# Patient Record
Sex: Female | Born: 2000 | Race: Black or African American | Hispanic: No | Marital: Single | Smoking: Never smoker
Health system: Southern US, Community
[De-identification: ages and names within clinical notes are randomized; demographics above are authoritative.]

## PROBLEM LIST (undated history)

## (undated) DIAGNOSIS — R011 Cardiac murmur, unspecified: Secondary | ICD-10-CM

## (undated) DIAGNOSIS — G510 Bell's palsy: Secondary | ICD-10-CM

## (undated) HISTORY — PX: NO PAST SURGERIES: SHX2092

## (undated) HISTORY — DX: Cardiac murmur, unspecified: R01.1

---
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
## 2022-05-09 ENCOUNTER — Inpatient Hospital Stay (HOSPITAL_COMMUNITY)
Admission: AD | Admit: 2022-05-09 | Discharge: 2022-05-09 | Disposition: A | Discharge status: Home / Self Care | Payer: Medicaid Other | Attending: Obstetrics and Gynecology | Admitting: Obstetrics and Gynecology

## 2022-05-09 ENCOUNTER — Encounter (HOSPITAL_COMMUNITY): Payer: Self-pay

## 2022-05-09 ENCOUNTER — Inpatient Hospital Stay (HOSPITAL_COMMUNITY): Payer: Medicaid Other

## 2022-05-09 ENCOUNTER — Other Ambulatory Visit: Payer: Self-pay

## 2022-05-09 DIAGNOSIS — O219 Vomiting of pregnancy, unspecified: Secondary | ICD-10-CM

## 2022-05-09 DIAGNOSIS — O26891 Other specified pregnancy related conditions, first trimester: Secondary | ICD-10-CM | POA: Diagnosis not present

## 2022-05-09 DIAGNOSIS — R55 Syncope and collapse: Secondary | ICD-10-CM | POA: Insufficient documentation

## 2022-05-09 DIAGNOSIS — Z3A09 9 weeks gestation of pregnancy: Secondary | ICD-10-CM | POA: Diagnosis not present

## 2022-05-09 DIAGNOSIS — R42 Dizziness and giddiness: Secondary | ICD-10-CM | POA: Diagnosis not present

## 2022-05-09 LAB — POCT PREGNANCY, URINE: Preg Test, Ur: POSITIVE — AB

## 2022-05-09 LAB — CBC
HCT: 36.2 % (ref 36.0–46.0)
Hemoglobin: 12.2 g/dL (ref 12.0–15.0)
MCH: 27.8 pg (ref 26.0–34.0)
MCHC: 33.7 g/dL (ref 30.0–36.0)
MCV: 82.5 fL (ref 80.0–100.0)
Platelets: 257 10*3/uL (ref 150–400)
RBC: 4.39 MIL/uL (ref 3.87–5.11)
RDW: 16.1 % — ABNORMAL HIGH (ref 11.5–15.5)
WBC: 13.5 10*3/uL — ABNORMAL HIGH (ref 4.0–10.5)
nRBC: 0 % (ref 0.0–0.2)

## 2022-05-09 LAB — ABO/RH: ABO/RH(D): A POS

## 2022-05-09 LAB — COMPREHENSIVE METABOLIC PANEL
ALT: 18 U/L (ref 0–44)
AST: 22 U/L (ref 15–41)
Albumin: 3.9 g/dL (ref 3.5–5.0)
Alkaline Phosphatase: 41 U/L (ref 38–126)
Anion gap: 8 (ref 5–15)
BUN: 10 mg/dL (ref 6–20)
CO2: 22 mmol/L (ref 22–32)
Calcium: 9.4 mg/dL (ref 8.9–10.3)
Chloride: 105 mmol/L (ref 98–111)
Creatinine, Ser: 0.71 mg/dL (ref 0.44–1.00)
GFR, Estimated: >60 mL/min (ref >60)
Glucose, Bld: 76 mg/dL (ref 70–99)
Potassium: 3.7 mmol/L (ref 3.5–5.1)
Sodium: 135 mmol/L (ref 135–145)
Total Bilirubin: 0.2 mg/dL — ABNORMAL LOW (ref 0.3–1.2)
Total Protein: 7.5 g/dL (ref 6.5–8.1)

## 2022-05-09 LAB — HCG, QUANTITATIVE, PREGNANCY: hCG, Beta Chain, Quant, S: 178761 m[IU]/mL — ABNORMAL HIGH (ref <5)

## 2022-05-09 LAB — GLUCOSE, CAPILLARY: Glucose-Capillary: 85 mg/dL (ref 70–99)

## 2022-05-09 MED ORDER — PROMETHAZINE HCL 12.5 MG PO TABS
12.5000 mg | ORAL_TABLET | Freq: Four times a day (QID) | ORAL | 0 refills | Status: DC | PRN
Start: 2022-05-09 — End: 2022-12-13

## 2022-05-09 MED ORDER — BONJESTA 20-20 MG PO TBCR: 1.0000 | EXTENDED_RELEASE_TABLET | Freq: Every day | ORAL | 2 refills | Status: DC

## 2022-05-09 NOTE — MAU Provider Note (Signed)
History     CSN: [id number]  Arrival date and time: 05/09/22 1935   Event Date/Time   First Provider Initiated Contact with Patient 05/09/22 2044      Chief Complaint  Patient presents with   Near Syncope    dizziness   Nausea   Emesis   Leslie Bryan is a 21 y.o. G1P0 at [redacted]w[redacted]d with Estimated LMP of 03/07/2022.  She presents today for Near Syncope (dizziness), Nausea, and Emesis.  She reports she was walking around and started "feeling lightheaded and dizzy with shallow breathing."  She states she sat down, but the symptoms continued and when trying to get up she "fell forward on my face" and required assistance to get up. She reports taking a home UPT about 5 weeks ago.  She reports she has not eaten anything and been able to keep it down for the past 3 days.  She states she has been trying to take dramamine with no relief of symptoms. She reports she was a daily MJ usage and has not discontinued but "slowed down."  She reports last vomiting about 2 hours ago.    OB History     Gravida  1   Para  0   Term  0   Preterm  0   AB  0   Living  0      SAB  0   IAB  0   Ectopic  0   Multiple  0   Live Births  0           Past Medical History:  Diagnosis Date   Bell's palsy    Heart murmur     History reviewed. No pertinent surgical history.  Family History  Problem Relation Age of Onset   Healthy Mother    Healthy Father     Social History   Tobacco Use   Smoking status: Never    Passive exposure: Yes   Smokeless tobacco: Never  Vaping Use   Vaping Use: Never used  Substance Use Topics   Alcohol use: Not Currently   Drug use: Yes    Types: Marijuana    Allergies: No Known Allergies  Medications Prior to Admission  Medication Sig Dispense Refill Last Dose   cyclobenzaprine (FLEXERIL) 5 MG tablet Take 1 tablet (5 mg total) by mouth 2 (two) times daily as needed for muscle spasms. (Patient not taking: Reported on 05/09/2022) 30 tablet 0  Not Taking   emtricitabine-tenofovir (TRUVADA) 200-300 MG tablet Take 1 tablet by mouth daily. (Patient not taking: Reported on 05/09/2022) 28 tablet 0 Not Taking   HYDROcodone-acetaminophen (HYCET) 7.5-325 mg/15 ml solution Take 10 mLs by mouth 4 (four) times daily as needed for moderate pain. (Patient not taking: Reported on 05/09/2022) 118 mL 0 Not Taking   methocarbamol (ROBAXIN) 500 MG tablet Take 1 tablet (500 mg total) by mouth 2 (two) times daily. (Patient not taking: Reported on 05/09/2022) 20 tablet 0 Not Taking   misoprostol (CYTOTEC) 200 MCG tablet Take 1 tablet (200 mcg total) by mouth once for 1 dose. Insert inside cheek FOUR (4) hours before your appointment. Allow pill to soften in your cheeck, then swallow with water. 1 tablet 0    ondansetron (ZOFRAN) 4 MG tablet Take 1 tablet (4 mg total) by mouth daily as needed for nausea or vomiting. (Patient not taking: Reported on 05/09/2022) 12 tablet 1 Not Taking   ondansetron (ZOFRAN-ODT) 8 MG disintegrating tablet Take 1 tablet (8 mg total)  by mouth every 8 (eight) hours as needed for nausea or vomiting. (Patient not taking: Reported on 05/09/2022) 20 tablet 0 Not Taking   raltegravir (ISENTRESS) 400 MG tablet Take 1 tablet (400 mg total) by mouth 2 (two) times daily. (Patient not taking: Reported on 05/09/2022) 56 tablet 0 Not Taking    Review of Systems  Gastrointestinal:  Positive for nausea and vomiting.  Neurological:  Positive for dizziness and light-headedness. Negative for headaches.   Physical Exam   Blood pressure (!) 121/50, pulse 72, temperature 99.2 F (37.3 C), resp. rate 16, height 5\' 2"  (1.575 m), weight 89.8 kg, last menstrual period 03/07/2022.  Orthostatics: 111/62 (71),   Physical Exam Constitutional:      General: She is not in acute distress.    Appearance: Normal appearance. She is not ill-appearing.  HENT:     Head: Normocephalic and atraumatic.  Eyes:     Conjunctiva/sclera: Conjunctivae normal.   Cardiovascular:     Rate and Rhythm: Normal rate and regular rhythm.  Pulmonary:     Effort: Pulmonary effort is normal. No respiratory distress.     Breath sounds: Normal breath sounds.  Abdominal:     General: Bowel sounds are normal.     Palpations: Abdomen is soft.     Tenderness: There is no abdominal tenderness.  Musculoskeletal:        General: Normal range of motion.     Cervical back: Normal range of motion.  Skin:    General: Skin is warm and dry.  Neurological:     Mental Status: She is alert and oriented to person, place, and time.  Psychiatric:        Mood and Affect: Mood normal.        Behavior: Behavior normal.    MAU Course  Procedures Results for orders placed or performed during the hospital encounter of 05/09/22 (from the past 24 hour(s))  Pregnancy, urine POC     Status: Abnormal   Collection Time: 05/09/22  7:49 PM  Result Value Ref Range   Preg Test, Ur POSITIVE (A) NEGATIVE  Glucose, capillary     Status: None   Collection Time: 05/09/22  8:31 PM  Result Value Ref Range   Glucose-Capillary 85 70 - 99 mg/dL  ABO/Rh     Status: None   Collection Time: 05/09/22  9:16 PM  Result Value Ref Range   ABO/RH(D) A POS    No rh immune globuloin      NOT A RH IMMUNE GLOBULIN CANDIDATE, PT RH POSITIVE Performed at [facility], [address], [place], [place]   CBC     Status: Abnormal   Collection Time: 05/09/22  9:16 PM  Result Value Ref Range   WBC 13.5 (H) 4.0 - 10.5 K/uL   RBC 4.39 3.87 - 5.11 MIL/uL   Hemoglobin 12.2 12.0 - 15.0 g/dL   HCT 05/11/22 44.3 - 15.4 %   MCV 82.5 80.0 - 100.0 fL   MCH 27.8 26.0 - 34.0 pg   MCHC 33.7 30.0 - 36.0 g/dL   RDW 00.8 (H) 67.6 - 19.5 %   Platelets 257 150 - 400 K/uL   nRBC 0.0 0.0 - 0.2 %  Comprehensive metabolic panel     Status: Abnormal   Collection Time: 05/09/22  9:16 PM  Result Value Ref Range   Sodium 135 135 - 145 mmol/L   Potassium 3.7 3.5 - 5.1 mmol/L   Chloride 105 98 - 111 mmol/L  CO2 22 22 - 32 mmol/L   Glucose, Bld 76 70 - 99 mg/dL   BUN 10 6 - 20 mg/dL   Creatinine, Ser 0.71 0.44 - 1.00 mg/dL   Calcium 9.4 8.9 - 10.3 mg/dL   Total Protein 7.5 6.5 - 8.1 g/dL   Albumin 3.9 3.5 - 5.0 g/dL   AST 22 15 - 41 U/L   ALT 18 0 - 44 U/L   Alkaline Phosphatase 41 38 - 126 U/L   Total Bilirubin 0.2 (L) 0.3 - 1.2 mg/dL   GFR, Estimated >60 >60 mL/min   Anion gap 8 5 - 15  hCG, quantitative, pregnancy     Status: Abnormal   Collection Time: 05/09/22  9:16 PM  Result Value Ref Range   hCG, Beta Chain, Quant, S 178,761 (H) <5 mIU/mL   US OB Comp Less 14 Wks  Result Date: 05/09/2022 CLINICAL DATA:  Pregnant, unknown LMP EXAM: OBSTETRIC <14 WK ULTRASOUND TECHNIQUE: Transabdominal ultrasound was performed for evaluation of the gestation as well as the maternal uterus and adnexal regions. COMPARISON:  None Available. FINDINGS: Intrauterine gestational sac: Single Yolk sac:  Visualized. Embryo:  Visualized. Cardiac Activity: Visualized. Heart Rate: 187 bpm CRL:   25.6 mm   9 w 2 d                  [place] EDC: 12/10/2022 Subchorionic hemorrhage:  None visualized. Maternal uterus/adnexae: Bilateral ovaries are within normal limits. No free fluid. IMPRESSION: Single intrauterine gestational sac with cardiac activity, measuring 9 weeks 2 days by crown-rump length, as above. Electronically Signed   By: Julian Hy M.D.   On: 05/09/2022 21:21    MDM Orthostatic Vitals Labs: UA, UPT, CBC, hCG, ABO, CBG Ultrasound Prescription Assessment and Plan  21 year old G1P0 at 9 weeks Dizziness Near Syncope Nausea  -POC Reviewed. -Patient offered and declines antiemetic. -Discussed sending script to pharmacy for home usage. -CBG normal. Orthostatics normal without significant variations.  -Discussed how these factors play role in reported symptoms. -Encouraged high protein snacks and small frequent meals throughout the day to help with nausea as well as blood sugar levels. -Discussed  sending for [place] to confirm dates as patient reports uncertainty with continuation of pregnancy. -Labs ordered.  Maryann Conners 05/09/2022, 8:44 PM   Reassessment (9:50 PM) SIUP at 9.2 weeks  -Results as above. -Provider to bedside, patient still without nausea.  -Discussed taking medications at home when necessary and as prescribed. -Given list of providers in the community to start [facility] if deciding against termination. -Patient without questions or concerns. -Encouraged to call primary office or return to MAU if symptoms worsen or with the onset of new symptoms. -Discharged to home in stable condition.  Maryann Conners MSN, CNM Advanced Practice Provider, [facility]

## 2022-05-09 NOTE — Discharge Instructions (Signed)
  [place] [facility] Providers          [facility]  [address], [place], [place] [address]  [phone]  [facility]  [address], [place], [place] [address]  [phone]  [facility]  [address], [place], [place] [address]  [phone]  [facility] [place]  [address], [place], [place] [address]  [phone]  [facility]  [address]First floor), [place], [place] [address]  [phone]  [facility]  [address], [place], [place] [address]  [phone]  [facility]  [address], [place], [place] [address]  [phone]  [facility]  [address], [place], [place] [address]  [phone]  [facility]  [address], [place], [place] [address]  [phone]  [facility]  [address], [place], [place] [address]  [phone]  [facility]  [address],  [place], [place] [address]  [phone]  [facility]   [address], [place], [place] [address]  [phone]  [facility]  [address], [place], [place] [address]   [phone]  [facility]  [address], [place], [place] [address]  [phone]         

## 2022-05-09 NOTE — MAU Note (Addendum)
.  Leslie Bryan is a 21 y.o.G1P0  here in MAU reporting: Nasuea and vomiting x 3 days. Reported an episode today of dizziness with near syncope. Pt reports she did not lose consciousness but her arms were weak. Pt ambulatory to triage. Denies vaginal bleeding or abnormal discharge, or foul odor.  18g NS loc noted in [place] LMP: 03/07/2022 Onset of complaint: 3 days Pain score: 0 Vitals:   05/09/22 2013  BP: (!) 121/50  Pulse: 72  Resp: 16  Temp: 99.4 F (37.4 C)      Lab orders placed from triage:  UA

## 2022-06-16 LAB — OB RESULTS CONSOLE GC/CHLAMYDIA
Chlamydia: NEGATIVE
Neisseria Gonorrhea: NEGATIVE

## 2022-06-18 ENCOUNTER — Encounter: Payer: Self-pay | Admitting: *Deleted

## 2022-06-18 ENCOUNTER — Telehealth: Payer: Self-pay | Admitting: *Deleted

## 2022-06-18 ENCOUNTER — Telehealth: Payer: Medicaid Other

## 2022-06-18 NOTE — Telephone Encounter (Signed)
3:25pm. Patient [name] virtual new ob intake. I called her and heard message " call cannot be completed at this time" Leslie Bryan 3:32 I called again and heard message " call cannot be completed at this time" I sent message to front desk to notify. I also sent a MyChart message to patient to notify her and to tell her to call to reschedule. Leslie Bryan

## 2022-06-22 ENCOUNTER — Encounter: Payer: Medicaid Other | Admitting: Student

## 2022-06-24 ENCOUNTER — Inpatient Hospital Stay (HOSPITAL_COMMUNITY)
Admission: AD | Admit: 2022-06-24 | Discharge: 2022-06-24 | Disposition: A | Discharge status: Home / Self Care | Payer: Medicaid Other | Attending: Obstetrics and Gynecology | Admitting: Obstetrics and Gynecology

## 2022-06-24 ENCOUNTER — Encounter (HOSPITAL_COMMUNITY): Payer: Self-pay | Admitting: Obstetrics and Gynecology

## 2022-06-24 DIAGNOSIS — Z3A15 15 weeks gestation of pregnancy: Secondary | ICD-10-CM

## 2022-06-24 DIAGNOSIS — R102 Pelvic and perineal pain: Secondary | ICD-10-CM | POA: Insufficient documentation

## 2022-06-24 DIAGNOSIS — R519 Headache, unspecified: Secondary | ICD-10-CM | POA: Insufficient documentation

## 2022-06-24 DIAGNOSIS — O26892 Other specified pregnancy related conditions, second trimester: Secondary | ICD-10-CM

## 2022-06-24 DIAGNOSIS — N949 Unspecified condition associated with female genital organs and menstrual cycle: Secondary | ICD-10-CM | POA: Diagnosis not present

## 2022-06-24 LAB — URINALYSIS, ROUTINE W REFLEX MICROSCOPIC
Bilirubin Urine: NEGATIVE
Glucose, UA: NEGATIVE mg/dL
Hgb urine dipstick: NEGATIVE
Ketones, ur: NEGATIVE mg/dL
Leukocytes,Ua: NEGATIVE
Nitrite: NEGATIVE
Protein, ur: NEGATIVE mg/dL
Specific Gravity, Urine: 1.02 (ref 1.005–1.030)
pH: 7 (ref 5.0–8.0)

## 2022-06-24 MED ORDER — ACETAMINOPHEN 500 MG PO TABS
1000.0000 mg | ORAL_TABLET | Freq: Once | ORAL | Status: AC
  Administered 2022-06-24: 1000 mg via ORAL
  Filled 2022-06-24: qty 2

## 2022-06-24 MED ORDER — CAFFEINE 200 MG PO TABS: 200.0000 mg | ORAL_TABLET | Freq: Once | ORAL | Status: DC

## 2022-06-24 MED ORDER — METOCLOPRAMIDE HCL 10 MG PO TABS
10.0000 mg | ORAL_TABLET | Freq: Once | ORAL | Status: AC
  Administered 2022-06-24: 10 mg via ORAL
  Filled 2022-06-24: qty 1

## 2022-06-24 MED ORDER — ACETAMINOPHEN-CAFFEINE 500-65 MG PO TABS: 1.0000 | ORAL_TABLET | Freq: Once | ORAL | Status: DC

## 2022-06-24 NOTE — MAU Note (Signed)
.  Leslie Bryan is a 21 y.o. at [redacted]w[redacted]d here in MAU reporting: severe HA since yesterday around 0800, has been on and off for the last week. Pt took Tylenol 1000mg  and Motrin 400mg  , and only Tylenol helps with some relief. Pt also reports a sharp, pelvic pain on her left, and intense bilateral side of rib cage pain that feels throbbing and sharp. Pt denies VB or LOF.   Onset of complaint: one week Pain score: 8/10 HA, 6/10 pelvic, 7/10 rib cage Vitals:   06/24/22 2043  BP: 111/61  Pulse: 89  Resp: 18  Temp: 98.4 F (36.9 C)  SpO2: 100%     FHT:156 Lab orders placed from triage:  ua

## 2022-06-24 NOTE — Discharge Instructions (Signed)
Round Ligament Massage & Stretches  Massage: Starting at the middle of your pubic bone, trace little circles in a wide U from your pubic bone to your hip bones on both sides.  Then starting just above your pubic bone, press in and down, alternating sides to create a gentle rocking of your uterus back and forth.  Move your hands up the sides of your belly and back down. Do this 3-5 times upon waking and before bed.  Stretches: Get on hands and knees and alternate arching your back deeply while inhaling, and then rounding your back while exhaling. Modified runners lunge:  - Sit on a chair with half of your bottom on the chair and half off.  - Sit up tall, plant your front foot, and stretch your other foot out behind you.  - Breathe deeply for 5 breaths and then do the other side.    PREGNANCY SUPPORT BELT: You are not alone, Seventy-five percent of women have some sort of abdominal or back pain at some point in their pregnancy. Your baby is growing at a fast pace, which means that your whole body is rapidly trying to adjust to the changes. As your uterus grows, your back may start feeling a bit under stress and this can result in back or abdominal pain that can go from mild, and therefore bearable, to severe pains that will not allow you to sit or lay down comfortably, When it comes to dealing with pregnancy-related pains and cramps, some pregnant women usually prefer natural remedies, which the market is filled with nowadays. For example, wearing a pregnancy support belt can help ease and lessen your discomfort and pain. WHAT ARE THE BENEFITS OF WEARING A PREGNANCY SUPPORT BELT? A pregnancy support belt provides support to the lower portion of the belly taking some of the weight of the growing uterus and distributing to the other parts of your body. It is designed make you comfortable and gives you extra support. Over the years, the pregnancy apparel market has been studying the needs and wants of  pregnant women and they have come up with the most comfortable pregnancy support belts that woman could ever ask for. In fact, you will no longer have to wear a stretched-out or bulky pregnancy belt that is visible underneath your clothes and makes you feel even more uncomfortable. Nowadays, a pregnancy support belt is made of comfortable and stretchy materials that will not irritate your skin but will actually make you feel at ease and you will not even notice you are wearing it. They are easy to put on and adjust during the day and can be worn at night for additional support.  BENEFITS: Relives Back pain Relieves Abdominal Muscle and Leg Pain Stabilizes the Pelvic Ring Offers a Cushioned Abdominal Lift Pad Relieves pressure on the Sciatic Nerve Within Minutes WHERE TO GET YOUR PREGNANCY BELT: [facility] [phone] @[address] [place], [place] [address]  [facility]  [address], [place], [place] [address]  [phone]  [facility]  [address] [place], [place] [address]  [phone]  [facility]  [address] [place], [place] [address]  [phone]  [facility]  [address], [place], [place] [address]  [phone]  

## 2022-06-24 NOTE — MAU Provider Note (Signed)
History     CSN: [id number]  Arrival date and time: 06/24/22 2011   Event Date/Time   First Provider Initiated Contact with Patient 06/24/22 2126      Chief Complaint  Patient presents with   Abdominal Pain   Headache   Leslie Bryan , a  21 y.o. G1P0000 at [redacted]w[redacted]d presents to MAU with complaints of Headache over the last 24 hous. Attempted IBU, Tylenol without relief last dose 4 hours ago. Endorses Blurred Vision and photophobia. Denies nausea and vomiting. Denies hx of history. Describes as a "Frontal headache. Sharp achy. 8/10". Water Recall 3 cups :if that per day."  Also notes bilateral Rib and side pain, on-going for 1 week. Attempted the above without relief. Notes on directly on the sides. Describes  as Intermittent, sharpe and tight on the sides. Lasting 30-40 mins and spontaneously subsides. Massage, and position changes unaffected. Last tine this occurred was 9/10 currently no pain 0/10.      OB History     Gravida  1   Para  0   Term  0   Preterm  0   AB  0   Living  0      SAB  0   IAB  0   Ectopic  0   Multiple  0   Live Births  0           Past Medical History:  Diagnosis Date   Bell's palsy    Heart murmur     Past Surgical History:  Procedure Laterality Date   NO PAST SURGERIES      Family History  Problem Relation Age of Onset   Healthy Mother    Healthy Father     Social History   Tobacco Use   Smoking status: Never    Passive exposure: Yes   Smokeless tobacco: Never  Vaping Use   Vaping Use: Never used  Substance Use Topics   Alcohol use: Not Currently   Drug use: Not Currently    Types: Marijuana    Allergies: No Known Allergies  Medications Prior to Admission  Medication Sig Dispense Refill Last Dose   promethazine (PHENERGAN) 12.5 MG tablet Take 1 tablet (12.5 mg total) by mouth every 6 (six) hours as needed for nausea or vomiting. 30 tablet 0 Past Week   Doxylamine-Pyridoxine ER (BONJESTA) 20-20 MG  TBCR Take 1 tablet by mouth at bedtime. 60 tablet 2     Review of Systems  Constitutional:  Negative for chills, fatigue and fever.  Eyes:  Negative for pain and visual disturbance.  Respiratory:  Negative for apnea, shortness of breath and wheezing.   Cardiovascular:  Negative for chest pain and palpitations.  Gastrointestinal:  Negative for abdominal pain, constipation, diarrhea, nausea and vomiting.  Genitourinary:  Positive for pelvic pain. Negative for difficulty urinating, dysuria, vaginal bleeding, vaginal discharge and vaginal pain.  Musculoskeletal:  Negative for back pain.  Neurological:  Positive for dizziness and headaches. Negative for seizures and weakness.  Psychiatric/Behavioral:  Negative for suicidal ideas.    Physical Exam   Blood pressure (!) 112/58, pulse 78, temperature 98.4 F (36.9 C), temperature source Oral, resp. rate 18, height 5\' 2"  (1.575 m), weight 94.5 kg, last menstrual period 03/07/2022, SpO2 100 %.  Physical Exam Vitals and nursing note reviewed.  Constitutional:      General: She is not in acute distress.    Appearance: Normal appearance. She is not ill-appearing.  HENT:  Head: Normocephalic.  Cardiovascular:     Rate and Rhythm: Normal rate and regular rhythm.  Pulmonary:     Effort: Pulmonary effort is normal.  Abdominal:     Palpations: Abdomen is soft.     Tenderness: There is no abdominal tenderness.  Musculoskeletal:        General: Swelling present.     Cervical back: Normal range of motion.  Skin:    General: Skin is warm and dry.  Neurological:     Mental Status: She is alert and oriented to person, place, and time.     GCS: GCS eye subscore is 4. GCS verbal subscore is 5. GCS motor subscore is 6.     Deep Tendon Reflexes: Reflexes normal.  Psychiatric:        Mood and Affect: Mood normal.     MAU Course  Procedures Orders Placed This Encounter  Procedures   Urinalysis, Routine w reflex microscopic   Discharge patient    Meds ordered this encounter  Medications   DISCONTD: acetaminophen-caffeine (EXCEDRIN TENSION HEADACHE) 500-65 MG per tablet 1 tablet   DISCONTD: caffeine tablet 200 mg   acetaminophen (TYLENOL) tablet 1,000 mg   metoCLOPramide (REGLAN) tablet 10 mg   Results for orders placed or performed during the hospital encounter of 06/24/22 (from the past 24 hour(s))  Urinalysis, Routine w reflex microscopic     Status: Abnormal   Collection Time: 06/24/22  8:34 PM  Result Value Ref Range   Color, Urine YELLOW YELLOW   APPearance CLOUDY (A) CLEAR   Specific Gravity, Urine 1.020 1.005 - 1.030   pH 7.0 5.0 - 8.0   Glucose, UA NEGATIVE NEGATIVE mg/dL   Hgb urine dipstick NEGATIVE NEGATIVE   Bilirubin Urine NEGATIVE NEGATIVE   Ketones, ur NEGATIVE NEGATIVE mg/dL   Protein, ur NEGATIVE NEGATIVE mg/dL   Nitrite NEGATIVE NEGATIVE   Leukocytes,Ua NEGATIVE NEGATIVE     MDM - Urine cloudy but otherwise normal. Low suspicion for headache caused by dehydration.  - Headache improved 0/10 with PO Excedrin PO and Hydration.  - Plan for discharge.   Assessment and Plan   1. Headache in pregnancy, antepartum, second trimester   2. [redacted] weeks gestation of pregnancy   3. Round ligament pain    - Reviewed the normalcy of headaches in pregnancy. Alternative comfort measures reviewed in MAU - Also discussed round ligament pain and some stretching that can ben performed while at home.Also recommended a maternity support belt. List of store options provided. - Worsening signs and return precautions reviewed.  - Preterm labor precautions reviewed.  - Patient discharged home in stable condition and may return to MAU as needed.     Claudette Head, MSN CNM  06/24/2022, 9:27 PM

## 2022-07-01 ENCOUNTER — Telehealth: Payer: Medicaid Other

## 2022-07-09 LAB — OB RESULTS CONSOLE ABO/RH: "RH Type ": POSITIVE

## 2022-07-09 LAB — OB RESULTS CONSOLE RPR: RPR: NONREACTIVE

## 2022-07-09 LAB — OB RESULTS CONSOLE HEPATITIS B SURFACE ANTIGEN: Hepatitis B Surface Ag: NEGATIVE

## 2022-07-09 LAB — HEPATITIS C ANTIBODY: HCV Ab: NEGATIVE

## 2022-07-09 LAB — OB RESULTS CONSOLE ANTIBODY SCREEN: Antibody Screen: NEGATIVE

## 2022-07-09 LAB — OB RESULTS CONSOLE HIV ANTIBODY (ROUTINE TESTING): HIV: NONREACTIVE

## 2022-07-09 LAB — OB RESULTS CONSOLE RUBELLA ANTIBODY, IGM: Rubella: IMMUNE

## 2022-07-13 ENCOUNTER — Other Ambulatory Visit: Payer: Self-pay | Admitting: Obstetrics and Gynecology

## 2022-07-13 DIAGNOSIS — Z363 Encounter for antenatal screening for malformations: Secondary | ICD-10-CM

## 2022-07-21 ENCOUNTER — Telehealth: Payer: Medicaid Other

## 2022-07-21 ENCOUNTER — Encounter: Payer: Self-pay | Admitting: Family Medicine

## 2022-07-27 ENCOUNTER — Encounter: Payer: Medicaid Other | Admitting: Obstetrics and Gynecology

## 2022-08-12 ENCOUNTER — Ambulatory Visit: Payer: Medicaid Other | Admitting: *Deleted

## 2022-08-12 ENCOUNTER — Ambulatory Visit: Payer: Medicaid Other | Attending: Obstetrics and Gynecology

## 2022-08-12 VITALS — BP 127/69 | HR 86

## 2022-08-12 DIAGNOSIS — Z363 Encounter for antenatal screening for malformations: Secondary | ICD-10-CM | POA: Insufficient documentation

## 2022-08-12 DIAGNOSIS — Z3689 Encounter for other specified antenatal screening: Secondary | ICD-10-CM | POA: Insufficient documentation

## 2022-08-13 ENCOUNTER — Other Ambulatory Visit: Payer: Self-pay | Admitting: *Deleted

## 2022-08-13 DIAGNOSIS — O0932 Supervision of pregnancy with insufficient antenatal care, second trimester: Secondary | ICD-10-CM

## 2022-09-09 ENCOUNTER — Ambulatory Visit: Payer: Medicaid Other | Admitting: *Deleted

## 2022-09-09 ENCOUNTER — Ambulatory Visit: Payer: Medicaid Other | Attending: Maternal & Fetal Medicine

## 2022-09-09 VITALS — BP 126/62 | HR 87

## 2022-09-09 DIAGNOSIS — O99891 Other specified diseases and conditions complicating pregnancy: Secondary | ICD-10-CM

## 2022-09-09 DIAGNOSIS — G510 Bell's palsy: Secondary | ICD-10-CM | POA: Diagnosis not present

## 2022-09-09 DIAGNOSIS — O283 Abnormal ultrasonic finding on antenatal screening of mother: Secondary | ICD-10-CM | POA: Insufficient documentation

## 2022-09-09 DIAGNOSIS — R011 Cardiac murmur, unspecified: Secondary | ICD-10-CM

## 2022-09-09 DIAGNOSIS — O99352 Diseases of the nervous system complicating pregnancy, second trimester: Secondary | ICD-10-CM | POA: Diagnosis not present

## 2022-09-09 DIAGNOSIS — O0932 Supervision of pregnancy with insufficient antenatal care, second trimester: Secondary | ICD-10-CM

## 2022-09-09 DIAGNOSIS — E669 Obesity, unspecified: Secondary | ICD-10-CM

## 2022-09-09 DIAGNOSIS — O99212 Obesity complicating pregnancy, second trimester: Secondary | ICD-10-CM

## 2022-09-09 DIAGNOSIS — Z3A26 26 weeks gestation of pregnancy: Secondary | ICD-10-CM

## 2022-10-27 ENCOUNTER — Inpatient Hospital Stay (HOSPITAL_BASED_OUTPATIENT_CLINIC_OR_DEPARTMENT_OTHER): Payer: Medicaid Other

## 2022-10-27 ENCOUNTER — Inpatient Hospital Stay (HOSPITAL_COMMUNITY)
Admission: AD | Admit: 2022-10-27 | Discharge: 2022-10-28 | Disposition: A | Discharge status: Home / Self Care | Payer: Medicaid Other | Attending: Obstetrics & Gynecology | Admitting: Obstetrics & Gynecology

## 2022-10-27 DIAGNOSIS — Z3A33 33 weeks gestation of pregnancy: Secondary | ICD-10-CM

## 2022-10-27 DIAGNOSIS — O26893 Other specified pregnancy related conditions, third trimester: Secondary | ICD-10-CM

## 2022-10-27 DIAGNOSIS — O99413 Diseases of the circulatory system complicating pregnancy, third trimester: Secondary | ICD-10-CM | POA: Diagnosis not present

## 2022-10-27 DIAGNOSIS — R0789 Other chest pain: Secondary | ICD-10-CM | POA: Diagnosis not present

## 2022-10-27 DIAGNOSIS — O26899 Other specified pregnancy related conditions, unspecified trimester: Secondary | ICD-10-CM | POA: Diagnosis not present

## 2022-10-27 DIAGNOSIS — I498 Other specified cardiac arrhythmias: Secondary | ICD-10-CM | POA: Insufficient documentation

## 2022-10-27 DIAGNOSIS — R102 Pelvic and perineal pain: Secondary | ICD-10-CM

## 2022-10-27 DIAGNOSIS — R109 Unspecified abdominal pain: Secondary | ICD-10-CM | POA: Diagnosis not present

## 2022-10-27 DIAGNOSIS — O4700 False labor before 37 completed weeks of gestation, unspecified trimester: Secondary | ICD-10-CM

## 2022-10-27 DIAGNOSIS — O4703 False labor before 37 completed weeks of gestation, third trimester: Secondary | ICD-10-CM | POA: Insufficient documentation

## 2022-10-27 LAB — WET PREP, GENITAL
Clue Cells Wet Prep HPF POC: NONE SEEN
Sperm: NONE SEEN
Trich, Wet Prep: NONE SEEN
WBC, Wet Prep HPF POC: >=10 — AB (ref <10)

## 2022-10-27 LAB — FETAL FIBRONECTIN: Fetal Fibronectin: NEGATIVE

## 2022-10-27 LAB — URINALYSIS, ROUTINE W REFLEX MICROSCOPIC
Bilirubin Urine: NEGATIVE
Glucose, UA: NEGATIVE mg/dL
Hgb urine dipstick: NEGATIVE
Ketones, ur: 80 mg/dL — AB
Nitrite: NEGATIVE
Protein, ur: NEGATIVE mg/dL
Specific Gravity, Urine: 1.017 (ref 1.005–1.030)
pH: 6 (ref 5.0–8.0)

## 2022-10-27 MED ORDER — HYOSCYAMINE SULFATE 0.125 MG SL SUBL
0.1250 mg | SUBLINGUAL_TABLET | Freq: Once | SUBLINGUAL | Status: AC
  Administered 2022-10-27: 0.125 mg via SUBLINGUAL
  Filled 2022-10-27: qty 1

## 2022-10-27 MED ORDER — SIMETHICONE 80 MG PO CHEW
80.0000 mg | CHEWABLE_TABLET | Freq: Once | ORAL | Status: AC
  Administered 2022-10-27: 80 mg via ORAL
  Filled 2022-10-27: qty 1

## 2022-10-27 MED ORDER — ALUM & MAG HYDROXIDE-SIMETH 200-200-20 MG/5ML PO SUSP
30.0000 mL | Freq: Once | ORAL | Status: AC
  Administered 2022-10-27: 30 mL via ORAL
  Filled 2022-10-27: qty 30

## 2022-10-27 MED ORDER — LIDOCAINE VISCOUS HCL 2 % MT SOLN
15.0000 mL | Freq: Once | OROMUCOSAL | Status: AC
  Administered 2022-10-27: 15 mL via ORAL
  Filled 2022-10-27: qty 15

## 2022-10-27 MED ORDER — NIFEDIPINE 10 MG PO CAPS
10.0000 mg | ORAL_CAPSULE | ORAL | Status: DC | PRN
  Administered 2022-10-27 (×2): 10 mg via ORAL
  Filled 2022-10-27 (×2): qty 1

## 2022-10-27 MED ORDER — CYCLOBENZAPRINE HCL 5 MG PO TABS: 10.0000 mg | ORAL_TABLET | Freq: Once | ORAL | Status: DC

## 2022-10-27 NOTE — MAU Note (Signed)
Leslie Bryan is a 21 y.o. at 86w3dhere in MAU reporting: when she woke up (1130-1145)was having a real severe pain above her belly button, in the middle.   Has had tightness in her chest, comes and goes, started around 1200.  No fever, has had a horrible cough the past wk, was coughing up brown mucous, is finally getting over that.  No bleeding or LOF, reports +FM Onset of complaint: 1130 Pain score: abd  7, chest 4 (not really hurting now) Vitals:   10/27/22 1837 10/27/22 1840  BP:  133/71  Pulse:  76  Resp:  20  Temp:  98.9 F (37.2 C)  SpO2: 100% 100%     FHT:144 Lab orders placed from triage:  urine  O2sat remained at 100% the entire time in triage

## 2022-10-27 NOTE — MAU Provider Note (Cosign Needed Addendum)
History     CSN: [id number]  Arrival date and time: 10/27/22 1743   Event Date/Time   First Provider Initiated Contact with Patient 10/27/22 1936      Chief Complaint  Patient presents with   Chest Pain   Abdominal Pain   Leslie Bryan , a  22 y.o. G1P0000 at 27w3dpresents to MAU with complaints of "chest tightness" and "abdominal pain", that started today. Patient reports that she woke up this afternoon about 1230 and states she had some "chest tightness and pressure" on the center of her chest between her breast. She states that at that time pain was 4/10, but currently states its "not that bad." She also endorses a "horrible cough" for the past week. She denies fever, chills, cold and flu-like symptoms. She also denies a history of asthma. She endorse acid reflux but denies it "feeling the same." She also reports intermittent abdominal pain right above her belly button. She denies feeling like contractions. She states the pain is like abdominal pressure and "gets more intense" but never goes away. She states pain is worsened by fetal movement. Attempted PO Tylenol today at 1pm without relief. She denies vaginal bleeding and leaking of fluid but noted that she has been having green vaginal discharge for the past week. She endorses positive fetal movement.          OB History     Gravida  1   Para  0   Term  0   Preterm  0   AB  0   Living  0      SAB  0   IAB  0   Ectopic  0   Multiple  0   Live Births  0           Past Medical History:  Diagnosis Date   Bell's palsy    Heart murmur     Past Surgical History:  Procedure Laterality Date   NO PAST SURGERIES      Family History  Problem Relation Age of Onset   Cancer Mother    Healthy Mother    Healthy Father    Hypertension Maternal Grandmother    Cancer Other    Asthma Neg Hx    Diabetes Neg Hx    Heart disease Neg Hx    Stroke Neg Hx     Social History   Tobacco Use   Smoking  status: Never    Passive exposure: Yes   Smokeless tobacco: Never  Vaping Use   Vaping Use: Never used  Substance Use Topics   Alcohol use: Not Currently   Drug use: Not Currently    Types: Marijuana    Comment: last weed Jul 24 2022    Allergies: No Known Allergies  Medications Prior to Admission  Medication Sig Dispense Refill Last Dose   cholecalciferol (VITAMIN D3) 25 MCG (1000 UNIT) tablet Take 2,000 Units by mouth daily.      ferrous sulfate 325 (65 FE) MG EC tablet Take 325 mg by mouth 3 (three) times daily with meals.      Prenatal Vit-Fe Fumarate-FA (MULTIVITAMIN-PRENATAL) 27-0.8 MG TABS tablet Take 1 tablet by mouth daily at 12 noon.      promethazine (PHENERGAN) 12.5 MG tablet Take 1 tablet (12.5 mg total) by mouth every 6 (six) hours as needed for nausea or vomiting. (Patient not taking: Reported on 09/09/2022) 30 tablet 0     Review of Systems  Constitutional:  Negative for  chills, fatigue and fever.  Eyes:  Negative for pain and visual disturbance.  Respiratory:  Positive for cough and chest tightness. Negative for apnea, shortness of breath and wheezing.   Cardiovascular:  Negative for chest pain and palpitations.  Gastrointestinal:  Positive for abdominal pain. Negative for constipation, diarrhea, nausea and vomiting.  Genitourinary:  Positive for vaginal discharge. Negative for difficulty urinating, dysuria, pelvic pain, vaginal bleeding and vaginal pain.  Musculoskeletal:  Negative for back pain.  Neurological:  Negative for seizures, weakness and headaches.  Psychiatric/Behavioral:  Negative for suicidal ideas.    Physical Exam   Blood pressure 138/70, pulse 94, temperature 98.9 F (37.2 C), temperature source Oral, resp. rate 20, height '5\' 2"'$  (1.575 m), weight 101.7 kg, last menstrual period 03/07/2022, SpO2 100 %.  Physical Exam Vitals and nursing note reviewed.  Constitutional:      General: She is not in acute distress.    Appearance: Normal appearance.   HENT:     Head: Normocephalic.  Cardiovascular:     Rate and Rhythm: Normal rate.     Heart sounds: Normal heart sounds.  Pulmonary:     Effort: Pulmonary effort is normal.     Breath sounds: Normal breath sounds.  Chest:     Chest wall: No tenderness.  Abdominal:     Palpations: Abdomen is soft.     Tenderness: There is abdominal tenderness in the right upper quadrant and left upper quadrant.     Comments: Patient winces at palpation to lateral sides.   Musculoskeletal:     Cervical back: Normal range of motion.  Skin:    General: Skin is warm and dry.     Capillary Refill: Capillary refill takes 2 to 3 seconds.  Neurological:     Mental Status: She is alert and oriented to person, place, and time.  Psychiatric:        Mood and Affect: Mood normal.    FHT: 145bpm with moderate variability. Accels present, decels absent (appropriate for gestational age.)  Toco: UI     MAU Course  Procedures Orders Placed This Encounter  Procedures   Wet prep, genital   [place] MFM OB Limited   Urinalysis, Routine w reflex microscopic -Urine, Clean Catch   ED EKG   Meds ordered this encounter  Medications   DISCONTD: cyclobenzaprine (FLEXERIL) tablet 10 mg   AND Linked Order Group    alum & mag hydroxide-simeth (MAALOX/MYLANTA) 200-200-20 MG/5ML suspension 30 mL    lidocaine (XYLOCAINE) 2 % viscous mouth solution 15 mL    MDM - EKG results Sinus rhythm with Marked sinus arrhythmia.   - EKG Reviewed by Dr. Ernestina Patches in MAU. Per recommended outpatient OB Cards follow up.   - Report and Transfer of care given to M. Oday Ridings CNM at 8:46 PM Shantonette Isaias Sakai) Rollene Rotunda, MSN, [place] [facility]  10/27/22 8:48 PM   Did not feel any better with GI cocktail Levsin and Simethicone given with good relief Review of EFM tracing showed regular contractions at beginning of tracing FFn sent  Dilation: Closed Effacement (%): 0 Cervical Position: Posterior Station: -3 Presentation:  Vertex Exam by:: Jimmye Norman CNM  Will discharge home if FFn is negative Two doses of Procardia given with cessation of UCs FFn is negative Discharge home with PTL precautions   Assessment and Plan  Single IUP at 79w3dAbdominal pain in pregnancy Preterm uterine contractions with Neg FFn and no cervical dilation  Discharge home  PTL precautions Followup in  office  Encouraged to return if she develops worsening of symptoms, increase in pain, fever, or other concerning symptoms.   Seabron Spates, CNM

## 2022-10-28 LAB — GC/CHLAMYDIA PROBE AMP (~~LOC~~) NOT AT ARMC
Chlamydia: NEGATIVE
Comment: NEGATIVE
Comment: NORMAL
Neisseria Gonorrhea: NEGATIVE

## 2022-11-10 LAB — OB RESULTS CONSOLE GBS: GBS: NEGATIVE

## 2022-12-08 ENCOUNTER — Encounter (HOSPITAL_COMMUNITY): Payer: Self-pay

## 2022-12-08 ENCOUNTER — Other Ambulatory Visit: Payer: Self-pay | Admitting: Obstetrics and Gynecology

## 2022-12-08 ENCOUNTER — Telehealth (HOSPITAL_COMMUNITY): Payer: Self-pay | Admitting: *Deleted

## 2022-12-08 NOTE — Telephone Encounter (Signed)
Preadmission screen  

## 2022-12-09 ENCOUNTER — Other Ambulatory Visit: Payer: Self-pay | Admitting: Obstetrics and Gynecology

## 2022-12-09 ENCOUNTER — Telehealth (HOSPITAL_COMMUNITY): Payer: Self-pay | Admitting: *Deleted

## 2022-12-09 NOTE — Telephone Encounter (Signed)
Preadmission screen  

## 2022-12-10 ENCOUNTER — Encounter (HOSPITAL_COMMUNITY): Payer: Self-pay | Admitting: Obstetrics and Gynecology

## 2022-12-10 ENCOUNTER — Other Ambulatory Visit: Payer: Self-pay

## 2022-12-10 ENCOUNTER — Inpatient Hospital Stay (HOSPITAL_COMMUNITY): Payer: Medicaid Other | Admitting: Anesthesiology

## 2022-12-10 ENCOUNTER — Inpatient Hospital Stay (HOSPITAL_COMMUNITY): Payer: Medicaid Other

## 2022-12-10 ENCOUNTER — Inpatient Hospital Stay (HOSPITAL_COMMUNITY)
Admission: RE | Admit: 2022-12-10 | Discharge: 2022-12-13 | DRG: 787 | Disposition: A | Discharge status: Home / Self Care | Payer: Medicaid Other | Attending: Obstetrics and Gynecology | Admitting: Obstetrics and Gynecology

## 2022-12-10 DIAGNOSIS — O26893 Other specified pregnancy related conditions, third trimester: Secondary | ICD-10-CM | POA: Diagnosis present

## 2022-12-10 DIAGNOSIS — Z3490 Encounter for supervision of normal pregnancy, unspecified, unspecified trimester: Secondary | ICD-10-CM

## 2022-12-10 DIAGNOSIS — O480 Post-term pregnancy: Secondary | ICD-10-CM | POA: Diagnosis present

## 2022-12-10 DIAGNOSIS — O9902 Anemia complicating childbirth: Secondary | ICD-10-CM | POA: Diagnosis present

## 2022-12-10 DIAGNOSIS — O328XX0 Maternal care for other malpresentation of fetus, not applicable or unspecified: Secondary | ICD-10-CM | POA: Diagnosis present

## 2022-12-10 DIAGNOSIS — Z3A41 41 weeks gestation of pregnancy: Secondary | ICD-10-CM | POA: Diagnosis not present

## 2022-12-10 DIAGNOSIS — Z98891 History of uterine scar from previous surgery: Principal | ICD-10-CM

## 2022-12-10 DIAGNOSIS — O99214 Obesity complicating childbirth: Secondary | ICD-10-CM

## 2022-12-10 DIAGNOSIS — R030 Elevated blood-pressure reading, without diagnosis of hypertension: Secondary | ICD-10-CM | POA: Insufficient documentation

## 2022-12-10 LAB — TYPE AND SCREEN
ABO/RH(D): A POS
Antibody Screen: NEGATIVE

## 2022-12-10 LAB — RPR: RPR Ser Ql: NONREACTIVE

## 2022-12-10 LAB — CBC
HCT: 32.4 % — ABNORMAL LOW (ref 36.0–46.0)
Hemoglobin: 10.9 g/dL — ABNORMAL LOW (ref 12.0–15.0)
MCH: 30 pg (ref 26.0–34.0)
MCHC: 33.6 g/dL (ref 30.0–36.0)
MCV: 89.3 fL (ref 80.0–100.0)
Platelets: 210 10*3/uL (ref 150–400)
RBC: 3.63 MIL/uL — ABNORMAL LOW (ref 3.87–5.11)
RDW: 13.8 % (ref 11.5–15.5)
WBC: 7.4 10*3/uL (ref 4.0–10.5)
nRBC: 0 % (ref 0.0–0.2)

## 2022-12-10 MED ORDER — SOD CITRATE-CITRIC ACID 500-334 MG/5ML PO SOLN: 30.0000 mL | ORAL | Status: DC | PRN

## 2022-12-10 MED ORDER — OXYTOCIN-SODIUM CHLORIDE 30-0.9 UT/500ML-% IV SOLN
1.0000 m[IU]/min | INTRAVENOUS | Status: DC
  Administered 2022-12-10 (×2): 2 m[IU]/min via INTRAVENOUS
  Filled 2022-12-10: qty 500

## 2022-12-10 MED ORDER — ONDANSETRON HCL 4 MG/2ML IJ SOLN: 4.0000 mg | Freq: Four times a day (QID) | INTRAMUSCULAR | Status: DC | PRN

## 2022-12-10 MED ORDER — LACTATED RINGERS IV SOLN
500.0000 mL | INTRAVENOUS | Status: DC | PRN
  Administered 2022-12-11: 500 mL via INTRAVENOUS

## 2022-12-10 MED ORDER — OXYCODONE-ACETAMINOPHEN 5-325 MG PO TABS: 1.0000 | ORAL_TABLET | ORAL | Status: DC | PRN

## 2022-12-10 MED ORDER — FENTANYL CITRATE (PF) 100 MCG/2ML IJ SOLN: 50.0000 ug | INTRAMUSCULAR | Status: DC | PRN

## 2022-12-10 MED ORDER — DIPHENHYDRAMINE HCL 50 MG/ML IJ SOLN: 12.5000 mg | INTRAMUSCULAR | Status: DC | PRN

## 2022-12-10 MED ORDER — TERBUTALINE SULFATE 1 MG/ML IJ SOLN: 0.2500 mg | Freq: Once | INTRAMUSCULAR | Status: DC | PRN

## 2022-12-10 MED ORDER — OXYTOCIN-SODIUM CHLORIDE 30-0.9 UT/500ML-% IV SOLN: 2.5000 [IU]/h | INTRAVENOUS | Status: DC

## 2022-12-10 MED ORDER — FENTANYL-BUPIVACAINE-NACL 0.5-0.125-0.9 MG/250ML-% EP SOLN
12.0000 mL/h | EPIDURAL | Status: DC | PRN
  Administered 2022-12-10: 12 mL/h via EPIDURAL
  Filled 2022-12-10: qty 250

## 2022-12-10 MED ORDER — LIDOCAINE HCL (PF) 1 % IJ SOLN: 30.0000 mL | INTRAMUSCULAR | Status: DC | PRN

## 2022-12-10 MED ORDER — EPHEDRINE 5 MG/ML INJ: 10.0000 mg | INTRAVENOUS | Status: DC | PRN

## 2022-12-10 MED ORDER — LACTATED RINGERS IV SOLN: INTRAVENOUS | Status: DC

## 2022-12-10 MED ORDER — FENTANYL CITRATE (PF) 100 MCG/2ML IJ SOLN
100.0000 ug | INTRAMUSCULAR | Status: DC | PRN
  Administered 2022-12-10 (×2): 100 ug via INTRAVENOUS
  Filled 2022-12-10 (×2): qty 2

## 2022-12-10 MED ORDER — LACTATED RINGERS IV SOLN
500.0000 mL | Freq: Once | INTRAVENOUS | Status: AC
  Administered 2022-12-10: 500 mL via INTRAVENOUS

## 2022-12-10 MED ORDER — ACETAMINOPHEN 325 MG PO TABS: 650.0000 mg | ORAL_TABLET | ORAL | Status: DC | PRN

## 2022-12-10 MED ORDER — LACTATED RINGERS IV SOLN: 500.0000 mL | INTRAVENOUS | Status: DC | PRN

## 2022-12-10 MED ORDER — OXYCODONE-ACETAMINOPHEN 5-325 MG PO TABS: 2.0000 | ORAL_TABLET | ORAL | Status: DC | PRN

## 2022-12-10 MED ORDER — PHENYLEPHRINE 80 MCG/ML (10ML) SYRINGE FOR IV PUSH (FOR BLOOD PRESSURE SUPPORT): 80.0000 ug | PREFILLED_SYRINGE | INTRAVENOUS | Status: DC | PRN

## 2022-12-10 MED ORDER — SODIUM CHLORIDE 0.9 % IV SOLN
12.5000 mg | Freq: Four times a day (QID) | INTRAVENOUS | Status: DC | PRN
  Administered 2022-12-10: 12.5 mg via INTRAVENOUS
  Filled 2022-12-10: qty 12.5

## 2022-12-10 MED ORDER — SOD CITRATE-CITRIC ACID 500-334 MG/5ML PO SOLN
30.0000 mL | ORAL | Status: DC | PRN
  Administered 2022-12-11: 30 mL via ORAL
  Filled 2022-12-10: qty 30

## 2022-12-10 MED ORDER — OXYTOCIN BOLUS FROM INFUSION: 333.0000 mL | Freq: Once | INTRAVENOUS | Status: DC

## 2022-12-10 MED ORDER — ONDANSETRON HCL 4 MG/2ML IJ SOLN
4.0000 mg | Freq: Four times a day (QID) | INTRAMUSCULAR | Status: DC | PRN
  Administered 2022-12-10: 4 mg via INTRAVENOUS
  Filled 2022-12-10: qty 2

## 2022-12-10 MED ORDER — LIDOCAINE HCL (PF) 1 % IJ SOLN
INTRAMUSCULAR | Status: DC | PRN
  Administered 2022-12-10: 3 mL via EPIDURAL
  Administered 2022-12-10: 5 mL via EPIDURAL

## 2022-12-10 NOTE — Anesthesia Procedure Notes (Signed)
Epidural Patient location during procedure: OB Start time: 12/10/2022 1:23 PM End time: 12/10/2022 1:28 PM  Staffing Anesthesiologist: Linton Rump, MD Performed: anesthesiologist   Preanesthetic Checklist Completed: patient identified, IV checked, site marked, risks and benefits discussed, surgical consent, monitors and equipment checked, pre-op evaluation and timeout performed  Epidural Patient position: sitting Prep: DuraPrep and site prepped and draped Patient monitoring: continuous pulse ox and blood pressure Approach: midline Location: L3-L4 Injection technique: LOR saline  Needle:  Needle type: Tuohy  Needle gauge: 17 G Needle length: 9 cm and 9 Needle insertion depth: 6 cm Catheter type: closed end flexible Catheter size: 19 Gauge Catheter at skin depth: 10 cm Test dose: negative  Assessment Events: blood not aspirated, no cerebrospinal fluid, injection not painful, no injection resistance, no paresthesia and negative IV test  Additional Notes The patient has requested an epidural for labor pain management. Risks and benefits including, but not limited to, infection, bleeding, local anesthetic toxicity, headache, hypotension, back pain, block failure, etc. were discussed with the patient. The patient expressed understanding and consented to the procedure. I confirmed that the patient has no bleeding disorders and is not taking blood thinners. I confirmed the patient's last platelet count with the nurse. A time-out was performed immediately prior to the procedure. Please see nursing documentation for vital signs. Sterile technique was used throughout the whole procedure. Once LOR achieved, the epidural catheter threaded easily without resistance. Aspiration of the catheter was negative for blood and CSF. The epidural was dosed slowly and an infusion was started.  1 attempt(s)Reason for block:procedure for pain

## 2022-12-10 NOTE — Progress Notes (Signed)
OB Progress Note  S: Patient resting comfortably with epidural.  O: BP 121/71   Pulse 76   Temp 98.5 F (36.9 C) (Oral)   Resp 15   Ht  (1.575 m)   Wt 105.7 kg   LMP 03/07/2022 (Exact Date)   SpO2 100%   BMI 42.62 kg/m   FHT: 135bpm, moderate variablity, - accels, recurrent late decels Toco: q less than one minute SVE: 7.5/80/-1, sutures palpated, LOA  A/P: 22 y.o. G1P0000 @ [redacted]w[redacted]d admitted for induction of labor for late term.  FWB: Cat. II Labor course: Pitocin at 42mU/min (recently decreased from 56mU/min), AROM at 1615 (clear), IUPC in place and MVUs are ~270 Pain: Comfortable with epidural GBS: Negative Anticipate SVD  Pitocin turned off to allow for fetal recovery due to recurrent late decelerations. Will allow for at least a 1 hour break from the Pitocin. If fetal heart tracing is reassuring in about 1 hour, will re-start Pitocin 2x2. Patient was nervous regarding the possible need for cesarean delivery. I counseled the patient that we will move toward cesarean delivery only if absolutely necessary. Reassurance provided.  Steva Ready, DO

## 2022-12-10 NOTE — Plan of Care (Signed)

## 2022-12-10 NOTE — H&P (Signed)
Leslie Bryan is a 22 y.o. female presenting for IOL at 41 weeks. She has no complaints.  . OB History     Gravida  1   Para  0   Term  0   Preterm  0   AB  0   Living  0      SAB  0   IAB  0   Ectopic  0   Multiple  0   Live Births  0          Past Medical History:  Diagnosis Date   Bell's palsy    Heart murmur    Past Surgical History:  Procedure Laterality Date   NO PAST SURGERIES     Family History: family history includes Cancer in her mother and another family member; Healthy in her father and mother; Hypertension in her maternal grandmother. Social History:  reports that she has never smoked. She has been exposed to tobacco smoke. She has never used smokeless tobacco. She reports that she does not currently use alcohol. She reports current drug use. Drug: Marijuana.     Maternal Diabetes: No Genetic Screening: Normal Maternal Ultrasounds/Referrals: Normal Fetal Ultrasounds or other Referrals:  None Maternal Substance Abuse:  No Significant Maternal Medications:  None Significant Maternal Lab Results:  Group B Strep negative Number of Prenatal Visits:greater than 3 verified prenatal visits Other Comments:  None  Review of Systems History Dilation: 3.5 Effacement (%): 60 Station: -2 Exam by:: Earlene Plater, RN Blood pressure (!) 146/109, pulse (!) 54, temperature 98.4 F (36.9 C), temperature source Oral, resp. rate 18, height  (1.575 m), weight 105.7 kg, last menstrual period 03/07/2022, SpO2 100 %. Exam Physical Exam  Physical Examination: General appearance - alert, well appearing, and in no distress Chest - clear to auscultation, no wheezes, rales or rhonchi, symmetric air entry Heart - normal rate and regular rhythm Abdomen - soft, nontender, nondistended, no masses or organomegaly Gravid EXT no calf tenderness B  Prenatal labs: ABO, Rh: --/--/A POS (04/18 0645) Antibody: NEG (04/18 0645) Rubella: Immune (11/16 0000) RPR: NON  REACTIVE (04/18 0643)  HBsAg: Negative (11/16 0000)  HIV: Non-reactive (11/16 0000)  GBS: Negative/-- (03/19 0000)   Assessment/Plan: IOL at 41 weeks Pitocin augmentation AROM Anticipate SVD Cat 1   Keita Demarco A Lorrena Goranson 12/10/2022, 1:17 PM

## 2022-12-10 NOTE — Progress Notes (Signed)
FHT Review:  140bpm baseline, moderate variability, + accel, - decel. Contractions irregular, q2-9 minutes. Pitocin restarted at 36mU/min.  Steva Ready, DO

## 2022-12-10 NOTE — Anesthesia Preprocedure Evaluation (Signed)
Anesthesia Evaluation  Patient identified by MRN, date of birth, ID band Patient awake    Reviewed: Allergy & Precautions, NPO status , Patient's Chart, lab work & pertinent test results  History of Anesthesia Complications Negative for: history of anesthetic complications  Airway Mallampati: III  TM Distance: >3 FB Neck ROM: Full    Dental   Pulmonary neg pulmonary ROS   Pulmonary exam normal breath sounds clear to auscultation       Cardiovascular (-) hypertension+ Valvular Problems/Murmurs  Rhythm:Regular Rate:Normal     Neuro/Psych neg Seizures  Neuromuscular disease (Bell's palsy)    GI/Hepatic negative GI ROS, Neg liver ROS,,,  Endo/Other  neg diabetes  Morbid obesity  Renal/GU negative Renal ROS     Musculoskeletal   Abdominal  (+) + obese  Peds  Hematology negative hematology ROS (+)   Anesthesia Other Findings   Reproductive/Obstetrics (+) Pregnancy                              Anesthesia Physical Anesthesia Plan  ASA: 3  Anesthesia Plan: Epidural   Post-op Pain Management:    Induction:   PONV Risk Score and Plan:   Airway Management Planned:   Additional Equipment:   Intra-op Plan:   Post-operative Plan:   Informed Consent: I have reviewed the patients History and Physical, chart, labs and discussed the procedure including the risks, benefits and alternatives for the proposed anesthesia with the patient or authorized representative who has indicated his/her understanding and acceptance.       Plan Discussed with: Anesthesiologist  Anesthesia Plan Comments: (I have discussed risks of neuraxial anesthesia including but not limited to infection, bleeding, nerve injury, back pain, headache, seizures, and failure of block. Patient denies bleeding disorders and is not currently anticoagulated. Labs have been reviewed. Risks and benefits discussed. All patient's  questions answered.  )         Anesthesia Quick Evaluation

## 2022-12-10 NOTE — Progress Notes (Signed)
Pt comfortable BP (!) 122/53   Pulse 69   Temp 98.4 F (36.9 C) (Oral)   Resp 16   Ht  (1.575 m)   Wt 105.7 kg   LMP 03/07/2022 (Exact Date)   SpO2 100%   BMI 42.62 kg/m   5-6 per RN Cat 2 with late decels Give position changes and fluid bolus Will Arom if needed

## 2022-12-10 NOTE — Progress Notes (Signed)
Vertex fetus per bedside ultrasound

## 2022-12-10 NOTE — Progress Notes (Signed)
Pain is controlled with an epidural BP 110/68   Pulse 63   Temp 98.5 F (36.9 C) (Oral)   Resp 16   Ht  (1.575 m)   Wt 105.7 kg   LMP 03/07/2022 (Exact Date)   SpO2 100%   BMI 42.62 kg/m  \ 6/50/-2 AROM clear  IUPC placed Position changed Cat 1 now Anticipate SVD

## 2022-12-11 ENCOUNTER — Encounter (HOSPITAL_COMMUNITY)
Admission: RE | Disposition: A | Discharge status: Home / Self Care | Payer: Self-pay | Source: Home / Self Care | Attending: Obstetrics and Gynecology

## 2022-12-11 ENCOUNTER — Encounter (HOSPITAL_COMMUNITY): Payer: Self-pay | Admitting: Obstetrics and Gynecology

## 2022-12-11 ENCOUNTER — Other Ambulatory Visit: Payer: Self-pay

## 2022-12-11 DIAGNOSIS — Z3A41 41 weeks gestation of pregnancy: Secondary | ICD-10-CM

## 2022-12-11 DIAGNOSIS — O480 Post-term pregnancy: Secondary | ICD-10-CM

## 2022-12-11 LAB — CBC
HCT: 30.6 % — ABNORMAL LOW (ref 36.0–46.0)
Hemoglobin: 10.4 g/dL — ABNORMAL LOW (ref 12.0–15.0)
MCH: 30.4 pg (ref 26.0–34.0)
MCHC: 34 g/dL (ref 30.0–36.0)
MCV: 89.5 fL (ref 80.0–100.0)
Platelets: 183 10*3/uL (ref 150–400)
RBC: 3.42 MIL/uL — ABNORMAL LOW (ref 3.87–5.11)
RDW: 13.9 % (ref 11.5–15.5)
WBC: 15.3 10*3/uL — ABNORMAL HIGH (ref 4.0–10.5)
nRBC: 0 % (ref 0.0–0.2)

## 2022-12-11 LAB — CREATININE, SERUM
Creatinine, Ser: 0.78 mg/dL (ref 0.44–1.00)
GFR, Estimated: >60 mL/min (ref >60)

## 2022-12-11 SURGERY — Surgical Case
Anesthesia: Epidural | Site: Abdomen

## 2022-12-11 MED ORDER — FENTANYL CITRATE (PF) 100 MCG/2ML IJ SOLN: 25.0000 ug | INTRAMUSCULAR | Status: DC | PRN

## 2022-12-11 MED ORDER — ACETAMINOPHEN 160 MG/5ML PO SOLN: 1000.0000 mg | Freq: Once | ORAL | Status: DC

## 2022-12-11 MED ORDER — OXYTOCIN-SODIUM CHLORIDE 30-0.9 UT/500ML-% IV SOLN
INTRAVENOUS | Status: DC | PRN
  Administered 2022-12-11: 30 [IU] via INTRAVENOUS

## 2022-12-11 MED ORDER — COCONUT OIL OIL: 1.0000 | TOPICAL_OIL | Status: DC | PRN

## 2022-12-11 MED ORDER — ACETAMINOPHEN 10 MG/ML IV SOLN
INTRAVENOUS | Status: AC
  Filled 2022-12-11: qty 100

## 2022-12-11 MED ORDER — SODIUM CHLORIDE 0.9 % IV SOLN: INTRAVENOUS | Status: DC | PRN

## 2022-12-11 MED ORDER — IBUPROFEN 600 MG PO TABS
600.0000 mg | ORAL_TABLET | Freq: Four times a day (QID) | ORAL | Status: DC
  Administered 2022-12-12 – 2022-12-13 (×5): 600 mg via ORAL
  Filled 2022-12-11 (×5): qty 1

## 2022-12-11 MED ORDER — CEFAZOLIN SODIUM-DEXTROSE 2-4 GM/100ML-% IV SOLN
2.0000 g | INTRAVENOUS | Status: AC
  Administered 2022-12-11: 2 g via INTRAVENOUS

## 2022-12-11 MED ORDER — FERROUS SULFATE 325 (65 FE) MG PO TABS
325.0000 mg | ORAL_TABLET | ORAL | Status: DC
  Administered 2022-12-11 – 2022-12-13 (×2): 325 mg via ORAL
  Filled 2022-12-11 (×2): qty 1

## 2022-12-11 MED ORDER — MORPHINE SULFATE (PF) 2 MG/ML IV SOLN: 1.0000 mg | INTRAVENOUS | Status: DC | PRN

## 2022-12-11 MED ORDER — MENTHOL 3 MG MT LOZG: 1.0000 | LOZENGE | OROMUCOSAL | Status: DC | PRN

## 2022-12-11 MED ORDER — KETOROLAC TROMETHAMINE 30 MG/ML IJ SOLN
30.0000 mg | Freq: Once | INTRAMUSCULAR | Status: AC
  Administered 2022-12-11: 30 mg via INTRAVENOUS

## 2022-12-11 MED ORDER — NALOXONE HCL 4 MG/10ML IJ SOLN: 1.0000 ug/kg/h | INTRAVENOUS | Status: DC | PRN

## 2022-12-11 MED ORDER — SIMETHICONE 80 MG PO CHEW: 80.0000 mg | CHEWABLE_TABLET | ORAL | Status: DC | PRN

## 2022-12-11 MED ORDER — DEXAMETHASONE SODIUM PHOSPHATE 10 MG/ML IJ SOLN
INTRAMUSCULAR | Status: DC | PRN
  Administered 2022-12-11: 10 mg via INTRAVENOUS

## 2022-12-11 MED ORDER — SENNOSIDES-DOCUSATE SODIUM 8.6-50 MG PO TABS
2.0000 | ORAL_TABLET | Freq: Every day | ORAL | Status: DC
  Administered 2022-12-12 – 2022-12-13 (×2): 2 via ORAL
  Filled 2022-12-11 (×2): qty 2

## 2022-12-11 MED ORDER — MORPHINE SULFATE (PF) 0.5 MG/ML IJ SOLN
INTRAMUSCULAR | Status: DC | PRN
  Administered 2022-12-11: 3 mg via EPIDURAL

## 2022-12-11 MED ORDER — OXYCODONE HCL 5 MG PO TABS
5.0000 mg | ORAL_TABLET | ORAL | Status: DC | PRN
  Administered 2022-12-12: 10 mg via ORAL
  Administered 2022-12-12 – 2022-12-13 (×2): 5 mg via ORAL
  Filled 2022-12-11: qty 1
  Filled 2022-12-11: qty 2
  Filled 2022-12-11: qty 1

## 2022-12-11 MED ORDER — SODIUM CHLORIDE 0.9 % IV SOLN
INTRAVENOUS | Status: AC
  Filled 2022-12-11: qty 5

## 2022-12-11 MED ORDER — DROPERIDOL 2.5 MG/ML IJ SOLN: 0.6250 mg | Freq: Once | INTRAMUSCULAR | Status: DC | PRN

## 2022-12-11 MED ORDER — PHENYLEPHRINE HCL (PRESSORS) 10 MG/ML IV SOLN
INTRAVENOUS | Status: DC | PRN
  Administered 2022-12-11: 160 ug via INTRAVENOUS

## 2022-12-11 MED ORDER — OXYTOCIN-SODIUM CHLORIDE 30-0.9 UT/500ML-% IV SOLN
INTRAVENOUS | Status: AC
  Filled 2022-12-11: qty 500

## 2022-12-11 MED ORDER — BUPIVACAINE LIPOSOME 1.3 % IJ SUSP
INTRAMUSCULAR | Status: DC | PRN
  Administered 2022-12-11: 20 mL

## 2022-12-11 MED ORDER — ENOXAPARIN SODIUM 60 MG/0.6ML IJ SOSY
50.0000 mg | PREFILLED_SYRINGE | INTRAMUSCULAR | Status: DC
  Administered 2022-12-12 – 2022-12-13 (×2): 50 mg via SUBCUTANEOUS
  Filled 2022-12-11 (×2): qty 0.6

## 2022-12-11 MED ORDER — KETOROLAC TROMETHAMINE 30 MG/ML IJ SOLN: 30.0000 mg | Freq: Four times a day (QID) | INTRAMUSCULAR | Status: AC | PRN

## 2022-12-11 MED ORDER — SODIUM CHLORIDE 0.9 % IV SOLN
500.0000 mg | INTRAVENOUS | Status: AC
  Administered 2022-12-11: 500 mg via INTRAVENOUS

## 2022-12-11 MED ORDER — OXYTOCIN-SODIUM CHLORIDE 30-0.9 UT/500ML-% IV SOLN
2.5000 [IU]/h | INTRAVENOUS | Status: AC
  Administered 2022-12-11: 2.5 [IU]/h via INTRAVENOUS
  Filled 2022-12-11: qty 500

## 2022-12-11 MED ORDER — DIPHENHYDRAMINE HCL 50 MG/ML IJ SOLN: 12.5000 mg | INTRAMUSCULAR | Status: DC | PRN

## 2022-12-11 MED ORDER — SODIUM CHLORIDE 0.9 % IR SOLN
Status: DC | PRN
  Administered 2022-12-11: 1

## 2022-12-11 MED ORDER — ACETAMINOPHEN 500 MG PO TABS
1000.0000 mg | ORAL_TABLET | Freq: Four times a day (QID) | ORAL | Status: DC
  Administered 2022-12-11 – 2022-12-13 (×9): 1000 mg via ORAL
  Filled 2022-12-11 (×6): qty 2

## 2022-12-11 MED ORDER — ONDANSETRON HCL 4 MG/2ML IJ SOLN
INTRAMUSCULAR | Status: AC
  Filled 2022-12-11: qty 2

## 2022-12-11 MED ORDER — DIPHENHYDRAMINE HCL 25 MG PO CAPS: 25.0000 mg | ORAL_CAPSULE | Freq: Four times a day (QID) | ORAL | Status: DC | PRN

## 2022-12-11 MED ORDER — DEXMEDETOMIDINE HCL IN NACL 80 MCG/20ML IV SOLN
INTRAVENOUS | Status: AC
  Filled 2022-12-11: qty 20

## 2022-12-11 MED ORDER — KETOROLAC TROMETHAMINE 30 MG/ML IJ SOLN
30.0000 mg | Freq: Four times a day (QID) | INTRAMUSCULAR | Status: AC
  Administered 2022-12-11 – 2022-12-12 (×4): 30 mg via INTRAVENOUS
  Filled 2022-12-11 (×4): qty 1

## 2022-12-11 MED ORDER — SIMETHICONE 80 MG PO CHEW
80.0000 mg | CHEWABLE_TABLET | Freq: Three times a day (TID) | ORAL | Status: DC
  Administered 2022-12-11 – 2022-12-13 (×6): 80 mg via ORAL
  Filled 2022-12-11 (×6): qty 1

## 2022-12-11 MED ORDER — ONDANSETRON HCL 4 MG/2ML IJ SOLN: 4.0000 mg | Freq: Three times a day (TID) | INTRAMUSCULAR | Status: DC | PRN

## 2022-12-11 MED ORDER — PRENATAL MULTIVITAMIN CH
1.0000 | ORAL_TABLET | Freq: Every day | ORAL | Status: DC
  Administered 2022-12-11 – 2022-12-13 (×3): 1 via ORAL
  Filled 2022-12-11 (×3): qty 1

## 2022-12-11 MED ORDER — STERILE WATER FOR IRRIGATION IR SOLN
Status: DC | PRN
  Administered 2022-12-11: 1000 mL

## 2022-12-11 MED ORDER — LACTATED RINGERS IV SOLN: INTRAVENOUS | Status: DC | PRN

## 2022-12-11 MED ORDER — LACTATED RINGERS IV SOLN: INTRAVENOUS | Status: DC

## 2022-12-11 MED ORDER — LIDOCAINE-EPINEPHRINE (PF) 2 %-1:200000 IJ SOLN
INTRAMUSCULAR | Status: DC | PRN
  Administered 2022-12-11: 5 mL via EPIDURAL

## 2022-12-11 MED ORDER — ACETAMINOPHEN 500 MG PO TABS
1000.0000 mg | ORAL_TABLET | Freq: Four times a day (QID) | ORAL | Status: AC
  Filled 2022-12-11 (×4): qty 2

## 2022-12-11 MED ORDER — NALOXONE HCL 0.4 MG/ML IJ SOLN: 0.4000 mg | INTRAMUSCULAR | Status: DC | PRN

## 2022-12-11 MED ORDER — KETOROLAC TROMETHAMINE 30 MG/ML IJ SOLN
INTRAMUSCULAR | Status: AC
  Filled 2022-12-11: qty 1

## 2022-12-11 MED ORDER — MORPHINE SULFATE (PF) 0.5 MG/ML IJ SOLN
INTRAMUSCULAR | Status: AC
  Filled 2022-12-11: qty 10

## 2022-12-11 MED ORDER — ZOLPIDEM TARTRATE 5 MG PO TABS: 5.0000 mg | ORAL_TABLET | Freq: Every evening | ORAL | Status: DC | PRN

## 2022-12-11 MED ORDER — FENTANYL CITRATE (PF) 100 MCG/2ML IJ SOLN
INTRAMUSCULAR | Status: DC | PRN
  Administered 2022-12-11: 100 ug via EPIDURAL

## 2022-12-11 MED ORDER — DIPHENHYDRAMINE HCL 25 MG PO CAPS: 25.0000 mg | ORAL_CAPSULE | ORAL | Status: DC | PRN

## 2022-12-11 MED ORDER — PHENYLEPHRINE 80 MCG/ML (10ML) SYRINGE FOR IV PUSH (FOR BLOOD PRESSURE SUPPORT)
PREFILLED_SYRINGE | INTRAVENOUS | Status: AC
  Filled 2022-12-11: qty 10

## 2022-12-11 MED ORDER — DEXMEDETOMIDINE HCL IN NACL 80 MCG/20ML IV SOLN
INTRAVENOUS | Status: DC | PRN
  Administered 2022-12-11: 8 ug via BUCCAL
  Administered 2022-12-11: 4 ug via BUCCAL
  Administered 2022-12-11: 8 ug via BUCCAL

## 2022-12-11 MED ORDER — ONDANSETRON HCL 4 MG/2ML IJ SOLN
INTRAMUSCULAR | Status: DC | PRN
  Administered 2022-12-11: 4 mg via INTRAVENOUS

## 2022-12-11 MED ORDER — FENTANYL CITRATE (PF) 100 MCG/2ML IJ SOLN
INTRAMUSCULAR | Status: AC
  Filled 2022-12-11: qty 2

## 2022-12-11 MED ORDER — ACETAMINOPHEN 10 MG/ML IV SOLN
INTRAVENOUS | Status: DC | PRN
  Administered 2022-12-11: 1000 mg via INTRAVENOUS

## 2022-12-11 MED ORDER — ACETAMINOPHEN 500 MG PO TABS: 1000.0000 mg | ORAL_TABLET | Freq: Once | ORAL | Status: DC

## 2022-12-11 MED ORDER — DIBUCAINE (PERIANAL) 1 % EX OINT: 1.0000 | TOPICAL_OINTMENT | CUTANEOUS | Status: DC | PRN

## 2022-12-11 MED ORDER — DEXAMETHASONE SODIUM PHOSPHATE 10 MG/ML IJ SOLN
INTRAMUSCULAR | Status: AC
  Filled 2022-12-11: qty 1

## 2022-12-11 MED ORDER — SODIUM CHLORIDE 0.9% FLUSH: 3.0000 mL | INTRAVENOUS | Status: DC | PRN

## 2022-12-11 MED ORDER — WITCH HAZEL-GLYCERIN EX PADS: 1.0000 | MEDICATED_PAD | CUTANEOUS | Status: DC | PRN

## 2022-12-11 SURGICAL SUPPLY — 38 items
BENZOIN TINCTURE PRP APPL 2/3 (GAUZE/BANDAGES/DRESSINGS) ×1 IMPLANT
CHLORAPREP W/TINT 26 (MISCELLANEOUS) ×2 IMPLANT
CLAMP UMBILICAL CORD (MISCELLANEOUS) ×1 IMPLANT
CLOTH BEACON ORANGE TIMEOUT ST (SAFETY) ×1 IMPLANT
DRAPE C SECTION CLR SCREEN (DRAPES) ×1 IMPLANT
DRSG OPSITE POSTOP 4X10 (GAUZE/BANDAGES/DRESSINGS) ×1 IMPLANT
DRSG TEGADERM 8X12 (GAUZE/BANDAGES/DRESSINGS) IMPLANT
ELECT REM PT RETURN 9FT ADLT (ELECTROSURGICAL) ×1
ELECTRODE REM PT RTRN 9FT ADLT (ELECTROSURGICAL) ×1 IMPLANT
EXTRACTOR VACUUM KIWI (MISCELLANEOUS) IMPLANT
GAUZE PAD ABD 7.5X8 STRL (GAUZE/BANDAGES/DRESSINGS) IMPLANT
GAUZE SPONGE 4X4 12PLY STRL LF (GAUZE/BANDAGES/DRESSINGS) IMPLANT
GLOVE BIO SURGEON STRL SZ 6.5 (GLOVE) ×1 IMPLANT
GLOVE BIOGEL PI IND STRL 7.0 (GLOVE) ×2 IMPLANT
GLOVE SURG SS PI 6.5 STRL IVOR (GLOVE) ×1 IMPLANT
GOWN STRL REUS W/ TWL LRG LVL3 (GOWN DISPOSABLE) ×3 IMPLANT
GOWN STRL REUS W/TWL LRG LVL3 (GOWN DISPOSABLE) ×3
KIT ABG SYR 3ML LUER SLIP (SYRINGE) IMPLANT
NDL HYPO 25X5/8 SAFETYGLIDE (NEEDLE) IMPLANT
NEEDLE HYPO 25X5/8 SAFETYGLIDE (NEEDLE) IMPLANT
NS IRRIG 1000ML POUR BTL (IV SOLUTION) ×1 IMPLANT
PACK C SECTION WH (CUSTOM PROCEDURE TRAY) ×1 IMPLANT
PAD OB MATERNITY 4.3X12.25 (PERSONAL CARE ITEMS) ×1 IMPLANT
RTRCTR C-SECT PINK 25CM LRG (MISCELLANEOUS) ×1 IMPLANT
SPONGE T-LAP 18X18 ~~LOC~~+RFID (SPONGE) IMPLANT
STRIP CLOSURE SKIN 1/2X4 (GAUZE/BANDAGES/DRESSINGS) ×1 IMPLANT
SUT MNCRL 0 VIOLET CTX 36 (SUTURE) ×2 IMPLANT
SUT MNCRL+ AB 3-0 CT1 36 (SUTURE) ×2 IMPLANT
SUT MONOCRYL 0 CTX 36 (SUTURE) ×2
SUT MONOCRYL AB 3-0 CT1 36IN (SUTURE) ×2
SUT PDS AB 0 CTX 36 PDP370T (SUTURE) ×2 IMPLANT
SUT PLAIN 0 NONE (SUTURE) IMPLANT
SUT VIC AB 2-0 CT1 27 (SUTURE)
SUT VIC AB 2-0 CT1 TAPERPNT 27 (SUTURE) IMPLANT
SUT VIC AB 4-0 KS 27 (SUTURE) ×1 IMPLANT
TOWEL OR 17X24 6PK STRL BLUE (TOWEL DISPOSABLE) ×2 IMPLANT
TRAY FOLEY W/BAG SLVR 14FR LF (SET/KITS/TRAYS/PACK) ×1 IMPLANT
WATER STERILE IRR 1000ML POUR (IV SOLUTION) ×1 IMPLANT

## 2022-12-11 NOTE — Progress Notes (Signed)
FHT Review:  135bpm, moderate variability, + accel. Tracing with previous periodic late decelerations and more recently with recurrent late decelerations. A prolonged deceleration occurred at about 0212. These late decelerations and the prolonged deceleration were resolved with position change per primary RN. Cervix 9.5/90/0 to +1. Pitocin at 29mU/min. MVUs appears to be ~125 currently. Contractions about every 2-5 minutes. Will leave Pitocin at 12mU/min for now given resolve in the decelerations after recent prolonged fetal heart rate deceleration and as patient has made some cervical change. Recheck due about 0500.  Steva Ready, DO

## 2022-12-11 NOTE — Progress Notes (Signed)
Notified of elevated DBP by RN. CNM to bedside for eval.  Subjective:    Denies pain, HA, visual changes, and RUQ pain.   Objective:       12/11/2022    9:00 AM 12/11/2022    8:04 AM 12/11/2022    7:45 AM  Vitals with BMI  Systolic 132 116 161  Diastolic 72 77 76  Pulse 65 73 74     Assessment/Plan:   21 y.o. G1P1001 [redacted]w[redacted]d POD #1 Primary C/S for fetal intolerance to labor    -stable postoperatively Risk for GHTN   -CBC, CMP, and LDH have already been ordered for 12/12/22 @ 0500  Leslie Schanz DNP, CNM 12/11/2022 3:09 PM

## 2022-12-11 NOTE — Transfer of Care (Signed)
Immediate Anesthesia Transfer of Care Note  Patient: Leslie Bryan  Procedure(s) Performed: CESAREAN SECTION (Abdomen)  Patient Location: PACU  Anesthesia Type:Epidural  Level of Consciousness: awake, alert , and oriented  Airway & Oxygen Therapy: Patient Spontanous Breathing  Post-op Assessment: Report given to RN and Post -op Vital signs reviewed and stable  Post vital signs: Reviewed and stable  Last Vitals:  Vitals Value Taken Time  BP 107/69 12/11/22 0700  Temp 37.2 C 12/11/22 0657  Pulse 92 12/11/22 0706  Resp 18 12/11/22 0706  SpO2 98 % 12/11/22 0706  Vitals shown include unvalidated device data.  Last Pain:  Vitals:   12/11/22 0657  TempSrc: Oral  PainSc: 0-No pain         Complications: No notable events documented.

## 2022-12-11 NOTE — Progress Notes (Signed)
In to room after review of fetal heart tracing. Fetal heart tracing with recurrent late decelerations lasting 2 minutes. Contractions every 3-4 minutes. Pitocin at 28mU/min but turned off upon my arrival to room. Cervix 8.5/80/-1, caput noted.  I counseled the patient that due to persistent Category II fetal heart tracing/fetal intolerance to labor that I recommend cesarean delivery. Unable to safely restart pitocin due to recurrent late decelerations. Patient was very tearful and upset regarding cesarean section as this was not in her plan and does not prefer surgery due to the abdominal scar and the fact that it is a major abdominal surgery. After leaving the room to give her a moment to process and discuss with her support persons (FOB and her mother), patient has decided to proceed. She felt like she had no choice. I counseled the patient that she does have a choice regarding her delivery; however, it is my strong recommendation to proceed with cesarean delivery for the safety of her baby. Patient verbalized understanding and agrees.  Anesthesia, L&D OR staff, and L&D staff aware. Azithromycin  and Ancef 2g on call to OR.   I have explained to the patient that this surgery is performed to deliver their baby or babies through an incision in the abdomen and incision in the uterus.  Prior to surgery, the risks and benefits of the surgery, as well as alternative treatments were discussed.  The risks include, but are not limited to, possible need for cesarean delivery for all future pregnancies, bleeding at the time of surgery that could necessitate a blood transfusion and/or hysterectomy, rupture of the uterus during a future pregnancy that could cause a preterm delivery and/or requiring hysterectomy, infection, damage to surrounding organs and tissues, damage to bladder, damage to ureters, causing kidney damage, and requiring additional procedures, damage to bowels, resulting in further surgery,  postoperative pain, short-term and long-term, scarring on the abdominal wall and intra-abdominally, need for further surgery, development of an incisional hernia, deep vein thrombosis and/or pulmonary embolism, wound infection and/or separation, painful intercourse, urinary leakage, impact on future pregnancies including but not limited to, abnormal location or attachment of the placenta to the uterus, such as placenta previa or accreta, that may necessitate a blood transfusion and/or hysterectomy, impact on total family size, complications the course of which cannot be predicted or prevented, and death. Patient was consented for blood products.  The patient is aware that bleeding may result in the need for a blood transfusion which includes risk of transmission of HIV (1:2 million), Hepatitis C (1:2 million), and Hepatitis B (1:200 thousand) and transfusion reaction.  Patient voiced understanding of the above risks as well as understanding of indications for blood transfusion.  Steva Ready, DO

## 2022-12-11 NOTE — Anesthesia Postprocedure Evaluation (Signed)
Anesthesia Post Note  Patient: Leslie Bryan  Procedure(s) Performed: CESAREAN SECTION (Abdomen)     Patient location during evaluation: PACU Anesthesia Type: Epidural Level of consciousness: awake and alert Pain management: pain level controlled Vital Signs Assessment: post-procedure vital signs reviewed and stable Respiratory status: spontaneous breathing, nonlabored ventilation and respiratory function stable Cardiovascular status: blood pressure returned to baseline Postop Assessment: no apparent nausea or vomiting and epidural receding Anesthetic complications: no  No notable events documented.               Shanda Howells

## 2022-12-11 NOTE — Op Note (Signed)
Pre Op Dx:   1. Single live IUP at [redacted]w[redacted]d 2. Persistent Category II Fetal Heart Tracing  Post Op Dx:  1. Single live IUP at [redacted]w[redacted]d 2. Persistent Category II Fetal Heart Tracing 3. Asynclitism   Procedure:   Low Transverse Cesarean Section  Surgeon:  Dr. Katrinka Blazing. Connye Burkitt Assistants:  Dr. Patrcia Dolly [facility] Fellow) Anesthesia:  Epidural  EBL:  868cc  IVF:  2300cc UOP:  50cc  Drains:  Foley catheter  Specimen removed:  Placenta - sent to pathology Device(s) implanted:  None Case Type:  Clean-contaminated Findings: Normal-appearing uterus, bilateral fallopian tubes, and ovaries. Fetus in cephalic position but was asynclitic. Loose nuchal cord x 1. APGAR: 7/8. Infant weight: 3175g (7lb 0oz). Complications: None Indications:  22 y.o. G1P0000 at [redacted]w[redacted]d undergoing induction of labor late term with persistent category II fetal heart tracing (recurrent late fetal heart rate decelerations and prolonged decelerations).  Procedure:  After informed consent was obtained, the patient was brought to the operating room.  Following confirmation of adequate epidural anesthesia, the patient was positioned in dorsal supine position with a leftward tilt and was prepped and draped in sterile fashion.  A preoperative time-out was performed.  The abdomen was entered in layers through a pfannenstiel incision and an Teacher, early years/pre was placed. A low transverse hysterotomy was created sharply to the level of the membranes, then extended bluntly.  The fetus was delivered from cephalic presentation onto the field.  Bulb suctioning was performed.  The cord was doubly clamped and cut after a 60 second pause.  The newborn was passed to the warmer.  The placenta was delivered.  The uterus was swept free of clots and debris and closed in a running locked fashion with 0-Monocryl, followed by 0-Monocryl in an imbricating fashion.  Hemostasis was verified.  The abdomen was irrigated with warmed saline and cleared of  clots.  The peritoneum was closed in a running fashion with 2-0 Vicryl.  Subfascial spaces were inspected and hemostasis assured.  The fascia was closed in a running fashion with 0-PDS. Exparel 20cc was injected in the fascia and subcutaneous tissues. The subcutaneous tissues were irrigated and hemostasis assured.  The subcutaneous tissues were closed with 3-0 Vicryl.  The skin was closed with 4-0 Vicryl.  A sterile bandage was applied.  The patient was transferred to PACU.  All needle, sponge, and instrument counts were correct at the end of the case.    Disposition:  PACU  Comments: I performed the procedure and the assistant was needed due to the complexity of the anatomy. An experienced assistant was required given the standard of surgical care given the complexity of the case.  This assistant was needed for exposure, dissection, suctioning, retraction, instrument exchange, assisting with delivery with administration of fundal pressure, and for overall help during the procedure.    Steva Ready, DO

## 2022-12-12 LAB — COMPREHENSIVE METABOLIC PANEL WITH GFR
ALT: 20 U/L (ref 0–44)
AST: 27 U/L (ref 15–41)
Albumin: 2.4 g/dL — ABNORMAL LOW (ref 3.5–5.0)
Alkaline Phosphatase: 134 U/L — ABNORMAL HIGH (ref 38–126)
Anion gap: 3 — ABNORMAL LOW (ref 5–15)
BUN: <5 mg/dL — ABNORMAL LOW (ref 6–20)
CO2: 22 mmol/L (ref 22–32)
Calcium: 8.4 mg/dL — ABNORMAL LOW (ref 8.9–10.3)
Chloride: 108 mmol/L (ref 98–111)
Creatinine, Ser: 0.7 mg/dL (ref 0.44–1.00)
GFR, Estimated: >60 mL/min
Glucose, Bld: 84 mg/dL (ref 70–99)
Potassium: 3.3 mmol/L — ABNORMAL LOW (ref 3.5–5.1)
Sodium: 133 mmol/L — ABNORMAL LOW (ref 135–145)
Total Bilirubin: 0.5 mg/dL (ref 0.3–1.2)
Total Protein: 5.4 g/dL — ABNORMAL LOW (ref 6.5–8.1)

## 2022-12-12 LAB — CBC
HCT: 25.8 % — ABNORMAL LOW (ref 36.0–46.0)
Hemoglobin: 8.4 g/dL — ABNORMAL LOW (ref 12.0–15.0)
MCH: 29.7 pg (ref 26.0–34.0)
MCHC: 32.6 g/dL (ref 30.0–36.0)
MCV: 91.2 fL (ref 80.0–100.0)
Platelets: 162 10*3/uL (ref 150–400)
RBC: 2.83 MIL/uL — ABNORMAL LOW (ref 3.87–5.11)
RDW: 14.1 % (ref 11.5–15.5)
WBC: 14.3 10*3/uL — ABNORMAL HIGH (ref 4.0–10.5)
nRBC: 0 % (ref 0.0–0.2)

## 2022-12-12 LAB — LACTATE DEHYDROGENASE: LDH: 206 U/L — ABNORMAL HIGH (ref 98–192)

## 2022-12-12 NOTE — Clinical Social Work Maternal (Signed)
CLINICAL SOCIAL WORK MATERNAL/CHILD NOTE  Patient Details  Name: Leslie Bryan MRN: [id number] Date of Birth: 2000/12/28  Date:  12/12/2022  Clinical Social Worker Initiating Note:  Leslie Bryan, LCSWA Date/Time: Initiated:  12/12/22/1257     Child's Name:  Leslie Bryan   Biological Parents:  Mother, Father (FOB: Jaclyn Prime, DOB: 02/28/2000)   Need for Interpreter:  None   Reason for Referral:  Behavioral Health Concerns, Current Substance Use/Substance Use During Pregnancy     Address:  [address] [place] [address]    Phone number:  [phone] (home)     Additional phone number:   Household Members/Support Persons (HM/SP):   Household Member/Support Person 1, Household Member/Support Person 2, Household Member/Support Person 3, Household Member/Support Person 4   HM/SP Name Relationship DOB or Age  HM/SP -1 Jahsir Bryan FOB 02/28/2000  HM/SP -2   FOB's Aunt    HM/SP -3   FOB's Aunt's son 28  HM/SP -4   FOB's Aunt's son 50  HM/SP -5        HM/SP -6        HM/SP -7        HM/SP -8          Natural Supports (not living in the home):  Immediate Family   Professional Supports: None   Employment: Unemployed   Type of Work:     Education:  Engineer, agricultural   Homebound arranged:    Surveyor, quantity Resources:  Medicaid   Other Resources:  Sales executive  , WIC   Cultural/Religious Considerations Which May Impact Care:  None identified  Strengths:  Ability to meet basic needs  , Home prepared for child     Psychotropic Medications:         Pediatrician:     Undecided, list provided  Pediatrician List:   [facility] [place]    [place]      Pediatrician Fax Number:    Risk Factors/Current Problems:  Substance Use  , Transportation     Cognitive State:  Able to Concentrate  , Linear Thinking  , Alert  , Goal Oriented     Mood/Affect:  Calm  , Comfortable  ,  Interested  , Euthymic     CSW Assessment: CSW was consulted due to history of Bipolar Disorder and documented use of marijuana during pregnancy. CSW met with MOB at bedside to complete assessment. When CSW entered room, MOB was observed sitting in hospital bed. FOB was nearby holding infant. CSW introduced self and requested to speak with MOB alone. FOB left the room. CSW explained reason for consult. MOB presented as calm, was agreeable to consult and remained engaged throughout encounter.   CSW inquired how MOB is feeling emotionally since infant's arrival. MOB shared she is feeling "fine", adding that she is feeling "overly joy" towards infant's arrival. CSW inquired about MOB's mental health history. MOB reports she was diagnosed with Bipolar Disorder and anxiety in [employer]. MOB described Bipolar Disorder symptoms marked by "mood swings." CSW inquired if MOB endorsed mental health symptoms during pregnancy. MOB reports she did have "off days" and described symptoms that align with hypermania, marked by endorsing episodes where she felt "(she) can't sit still", feelings of restlessness and bouts of increased motivation. MOB shared that she last endorsed feelings of increased energy about two months ago. MOB also reports anxiety  symptoms during pregnancy, marked by "constant worry" and "overthink(ing) situations." MOB identified becoming aware of her anxious feelings and taking a break by sitting down as a coping skill. CSW commended MOB for her insight regarding her mental health symptoms and utilizing healthy coping skills. MOB denied symptoms of depression during her pregnancy. MOB states she does not take psychotropic medication and is not current with a therapist. MOB declined outpatient mental health resources at this time. MOB identified her mother, FOB, and FOB's family as supports. MOB denied current SI/HI/DV. MOB denied a history of auditory or visual hallucinations.   CSW provided education  regarding the baby blues period vs. perinatal mood disorders, discussed treatment and gave resources for mental health follow up if concerns arise.  CSW recommends self-evaluation during the postpartum time period using the New Mom Checklist from Postpartum Progress and encouraged MOB to contact a medical professional if symptoms are noted at any time.    MOB reports she has all needed items for infant, including a car seat and bassinet but shared that she could use more diapers. CSW provided MOB with [employer] information for additional diapers and baby supplies. CSW provided information about [facility]) nurse home visiting program and explained that the nurse can provide diapers and wipes at follow up appointment. CSW also offered to place referral to [facility], which MOB provided verbal consent for CSW to do so. MOB shares she is undecided on a pediatrician for infant's follow up care but has a list.  CSW informed MOB about hospital drug screen policy due to documented use of marijuana during pregnancy. CSW explained that infant's UDS and CDS would be monitored and a CPS report would be made if warranted. MOB expressed understanding. CSW inquired about substance use/barriers to prenatal care during pregnancy. MOB confirmed that she smoked marijuana during pregnancy, sharing that she smoked prior to becoming pregnant and explained that when she tried to stop, she was not able to have an appetite. MOB reports she smoked marijuana about 2 times per week throughout her pregnancy. MOB denied using other illicit substances during pregnancy. MOB shared that she does have transportation barriers. MOB shared that she uses [employer] for transportation. MOB states that she recently found out about Medicaid transportation and shares she plans to utilize it in the future. CSW provided MOB with Medicaid transportation phone number.   CSW provided review of Sudden Infant Death Syndrome (SIDS)  precautions.    Infant's UDS pending.   Update 12/13/22 830: Infant's urine drug screen was not collected. CSW will follow CDS. CSW identifies no further need for intervention and no barriers to discharge at this time.  CSW Plan/Description:  Sudden Infant Death Syndrome (SIDS) Education, Perinatal Mood and Anxiety Disorder (PMADs) Education, Hospital Drug Screen Policy Information, Other Information/Referral to [employer], CSW Will Continue to Monitor Umbilical Cord Tissue Drug Screen Results and Make Report if Reggie Pile, LCSWA 12/12/2022, 1:03 PM

## 2022-12-12 NOTE — Progress Notes (Signed)
Subjective: POD# 1 Information for the patient's newborn:  Ashonti, Leandro [[id number]]  female   Baby's Name Ny'Zirr Circumcision prior to discharge  Reports feeling really good Feeding: breast and bottle Reports tolerating PO and denies N/V, foley removed, ambulating and urinating w/o difficulty  Pain controlled with  PO meds Denies HA/SOB/dizziness  Flatus passing Vaginal bleeding is normal, no clots     Objective:  VS:  Vitals:   12/11/22 1330 12/11/22 1500 12/11/22 1700 12/11/22 2118  BP:  124/77 106/77 135/85  Pulse:  (!) 51 62 (!) 54  Resp: Temp: 98.1 F (36.7 C)  98.2 F (36.8 C) 98.2 F (36.8 C)  TempSrc: Oral  Oral Oral  SpO2: 100%  100% 100%  Weight:      Height:        Intake/Output Summary (Last 24 hours) at 12/12/2022 0524 Last data filed at 12/11/2022 2008 Gross per 24 hour  Intake 4586.65 ml  Output 2860 ml  Net 1726.65 ml     Recent Labs    12/10/22 0643 12/11/22 0920  WBC 7.4 15.3*  HGB 10.9* 10.4*  HCT 32.4* 30.6*  PLT 210 183    Blood type: --/--/A POS (04/18 0645) Rubella: Immune (11/16 0000)    Physical Exam:  General: alert, cooperative, and no distress CV: Regular rate and rhythm or without murmur or extra heart sounds Resp: clear Abdomen: soft, nontender, normal bowel sounds Incision: clean, dry, intact, and pressure dressing removed Perineum:  Uterine Fundus: firm, below umbilicus, nontender Lochia: minimal and no clots Ext:  edema, negative for pain, tenderness, and cords   Assessment/Plan: 22 y.o.   POD# 1. G1P1001                  Principal Problem:   Postpartum care following vaginal delivery Active Problems:   Pregnancy   Encounter for induction of labor   Routine post-op PP care          Advance diet as tolerated Advised warm fluids and ambulation to improve GI motility Breastfeeding support Anticipate D/C on POD #2  Roma Schanz, DNP, CNM 12/12/2022, 5:24 AM

## 2022-12-13 ENCOUNTER — Encounter (HOSPITAL_COMMUNITY): Payer: Self-pay | Admitting: Obstetrics and Gynecology

## 2022-12-13 DIAGNOSIS — R030 Elevated blood-pressure reading, without diagnosis of hypertension: Secondary | ICD-10-CM | POA: Insufficient documentation

## 2022-12-13 DIAGNOSIS — Z98891 History of uterine scar from previous surgery: Principal | ICD-10-CM

## 2022-12-13 MED ORDER — IBUPROFEN 600 MG PO TABS: 600.0000 mg | ORAL_TABLET | Freq: Four times a day (QID) | ORAL | 0 refills | Status: AC

## 2022-12-13 MED ORDER — FERROUS SULFATE 325 (65 FE) MG PO TABS: 325.0000 mg | ORAL_TABLET | Freq: Every day | ORAL | 11 refills | Status: DC

## 2022-12-13 MED ORDER — OXYCODONE HCL 5 MG PO TABS: 5.0000 mg | ORAL_TABLET | ORAL | 0 refills | Status: AC | PRN

## 2022-12-13 MED ORDER — DOCUSATE SODIUM 100 MG PO CAPS: 100.0000 mg | ORAL_CAPSULE | Freq: Two times a day (BID) | ORAL | 0 refills | Status: AC

## 2022-12-13 NOTE — Discharge Summary (Signed)
OB Discharge Summary     Patient Name: Leslie Bryan DOB: 2001/01/04 MRN: [id number]  Date of admission: 12/10/2022 Delivering MD: Steva Ready   Date of discharge: 12/13/2022  Admitting diagnosis: Pregnancy [Z34.90] Encounter for induction of labor [Z34.90] Intrauterine pregnancy: [redacted]w[redacted]d     Secondary diagnosis:  Principal Problem:   Status post cesarean section Active Problems:   Pregnancy   Encounter for induction of labor   Elevated BP without diagnosis of hypertension  Additional problems: None     Discharge diagnosis: Term Pregnancy Delivered                                                                                                Post partum procedures: none  Augmentation: AROM and Pitocin  Complications: Hemorrhage>1046mL  Hospital course:  Induction of Labor With Cesarean Section   22 y.o. yo G1P1001 at [redacted]w[redacted]d was admitted to the hospital 12/10/2022 for induction of labor. Patient had a labor course significant for fetal intolerance to labor. The patient went for cesarean section due to Non-Reassuring FHR. Delivery details are as follows: Membrane Rupture Time/Date: 4:21 PM ,12/10/2022   Delivery Method:C-Section, Low Transverse  Details of operation can be found in separate operative Note.  Patient had a postpartum course complicated by PPH of 1035 cc and anemia with Hgb 8.4. She is ambulating, tolerating a regular diet, passing flatus, and urinating well.  Patient is discharged home in stable condition on 12/13/22.      Newborn Data: Birth date:12/11/2022  Birth time:6:00 AM  Gender:Female  Living status:Living  Apgars:7 ,9  Weight:3175 g                                Physical exam  Vitals:   12/11/22 2118 12/12/22 0606 12/12/22 1951 12/13/22 0524  BP: 135/85 121/71 129/78 127/82  Pulse: (!) 54 81 74 75  Resp: Temp: 98.2 F (36.8 C) 98.2 F (36.8 C) 97.8 F (36.6 C) 98 F (36.7 C)  TempSrc: Oral Oral Oral Oral  SpO2: 100% 100%     Weight:      Height:       General: alert, cooperative, and no distress Lochia: appropriate Uterine Fundus: firm Incision: Healing well with no significant drainage, Dressing is clean, dry, and intact DVT Evaluation: No evidence of DVT seen on physical exam. Labs: Lab Results  Component Value Date   WBC 14.3 (H) 12/12/2022   HGB 8.4 (L) 12/12/2022   HCT 25.8 (L) 12/12/2022   MCV 91.2 12/12/2022   PLT 162 12/12/2022      Latest Ref Rng & Units 12/12/2022    4:57 AM  CMP  Glucose 70 - 99 mg/dL 84   BUN 6 - 20 mg/dL <5   Creatinine 4.09 - 1.00 mg/dL 8.11   Sodium 914 - 782 mmol/L 133   Potassium 3.5 - 5.1 mmol/L 3.3   Chloride 98 - 111 mmol/L 108   CO2 22 - 32 mmol/L 22   Calcium 8.9 - 10.3 mg/dL  8.4   Total Protein 6.5 - 8.1 g/dL 5.4   Total Bilirubin 0.3 - 1.2 mg/dL 0.5   Alkaline Phos 38 - 126 U/L 134   AST 15 - 41 U/L 27   ALT 0 - 44 U/L 20     Discharge instruction: per After Visit Summary and "Baby and Me Booklet".  After visit meds:  Allergies as of 12/13/2022   No Known Allergies      Medication List     STOP taking these medications    ferrous sulfate 325 (65 FE) MG EC tablet Replaced by: ferrous sulfate 325 (65 FE) MG tablet   promethazine 12.5 MG tablet Commonly known as: PHENERGAN       TAKE these medications    cholecalciferol 25 MCG (1000 UNIT) tablet Commonly known as: VITAMIN D3 Take 2,000 Units by mouth daily.   docusate sodium 100 MG capsule Commonly known as: Colace Take 1 capsule (100 mg total) by mouth 2 (two) times daily for 10 days.   ferrous sulfate 325 (65 FE) MG tablet Take 1 tablet (325 mg total) by mouth daily with breakfast. Replaces: ferrous sulfate 325 (65 FE) MG EC tablet   ibuprofen 600 MG tablet Commonly known as: ADVIL Take 1 tablet (600 mg total) by mouth every 6 (six) hours for 10 days.   multivitamin-prenatal 27-0.8 MG Tabs tablet Take 1 tablet by mouth daily at 12 noon.   oxyCODONE 5 MG immediate  release tablet Commonly known as: Oxy IR/ROXICODONE Take 1 tablet (5 mg total) by mouth every 4 (four) hours as needed for up to 5 days for moderate pain.        Diet: routine diet, low salt diet, and iron rich diet  Activity: Advance as tolerated. Pelvic rest for 6 weeks.   Outpatient follow up:1 week for incision and BP check and 6 weeks for routine PP visit Follow up Appt:No future appointments. Follow up Visit:No follow-ups on file.  Postpartum contraception: Combination OCPs to [facility] at [facility] visit  Newborn Data: Live born female  Birth Weight: 7 lb (3175 g) APGAR: 7, 9  Newborn Delivery   Birth date/time: 12/11/2022 06:00:28 Delivery type: C-Section, Low Transverse Trial of labor: Yes C-section categorization: Primary      Baby Feeding: Bottle Disposition:home with mother      12/11/2022    8:04 AM  Edinburgh Postnatal Depression Scale Screening Tool  I have been able to laugh and see the funny side of things. 0  I have looked forward with enjoyment to things. 0  I have blamed myself unnecessarily when things went wrong. 1  I have been anxious or worried for no good reason. 1  I have felt scared or panicky for no good reason. 0  Things have been getting on top of me. 0  I have been so unhappy that I have had difficulty sleeping. 0  I have felt sad or miserable. 0  I have been so unhappy that I have been crying. 0  The thought of harming myself has occurred to me. 0  Edinburgh Postnatal Depression Scale Total 2        12/13/2022 Jackie Plum, MD

## 2022-12-15 LAB — SURGICAL PATHOLOGY

## 2022-12-17 ENCOUNTER — Ambulatory Visit (HOSPITAL_COMMUNITY)
Admission: EM | Admit: 2022-12-17 | Discharge: 2022-12-17 | Disposition: A | Discharge status: Home / Self Care | Payer: Medicaid Other | Attending: Family Medicine | Admitting: Family Medicine

## 2022-12-17 ENCOUNTER — Encounter (HOSPITAL_COMMUNITY): Payer: Self-pay

## 2022-12-17 DIAGNOSIS — J069 Acute upper respiratory infection, unspecified: Secondary | ICD-10-CM

## 2022-12-17 MED ORDER — BENZONATATE 200 MG PO CAPS: 200.0000 mg | ORAL_CAPSULE | Freq: Three times a day (TID) | ORAL | 0 refills | Status: DC | PRN

## 2022-12-17 NOTE — Discharge Instructions (Addendum)
You were seen today for a viral upper respiratory infection.  No evidence of bacterial infection needing an antibiotic.  I have sent out tessalon perles for your cough.  You may take over the counter sudafed or zyrtec to help with sinus congestion and fluids.  Please get plenty of rest and fluids as well.

## 2022-12-17 NOTE — ED Provider Notes (Signed)
[facility]    CSN: [id number] Arrival date & time: 12/17/22  1147      History   Chief Complaint Chief Complaint  Patient presents with   Cough   Nasal Congestion    HPI Leslie Bryan is a 22 y.o. female.   Patient is here for sinus congestion, cough for several days.  No fevers/chills. No sinus pain or pressure.  She does have chest pressure when she coughs too much.  No wheezing or sob.  She did take some dayquil with some help.  No known sick contacts.  She just had a baby last week.  She is not breast feeding.       Past Medical History:  Diagnosis Date   Bell's palsy    Heart murmur     Patient Active Problem List   Diagnosis Date Noted   Status post cesarean section 12/13/2022   Elevated BP without diagnosis of hypertension 12/13/2022   Pregnancy 12/10/2022   Encounter for induction of labor 12/10/2022   High risk social situation 06/04/2017   Obesity, pediatric, BMI 95th to 98th percentile for age 41/18/2015   Seasonal allergies 09/06/2012   Behavior problem 09/02/2011    Past Surgical History:  Procedure Laterality Date   CESAREAN SECTION N/A 12/11/2022   Procedure: CESAREAN SECTION;  Surgeon: Steva Ready, DO;  Location: [facility];  Service: Obstetrics;  Laterality: N/A;   NO PAST SURGERIES      OB History     Gravida  1   Para  1   Term  1   Preterm  0   AB  0   Living  1      SAB  0   IAB  0   Ectopic  0   Multiple  0   Live Births  1            Home Medications    Prior to Admission medications   Medication Sig Start Date End Date Taking? Authorizing Provider  cholecalciferol (VITAMIN D3) 25 MCG (1000 UNIT) tablet Take 2,000 Units by mouth daily.    [provider]  docusate sodium (COLACE) 100 MG capsule Take 1 capsule (100 mg total) by mouth 2 (two) times daily for 10 days. 12/13/22 12/23/22  Reesa Chew D, MD  ferrous sulfate 325 (65 FE) MG tablet Take 1 tablet (325 mg total)  by mouth daily with breakfast. 12/13/22 12/13/23  Jackie Plum, MD  ibuprofen (ADVIL) 600 MG tablet Take 1 tablet (600 mg total) by mouth every 6 (six) hours for 10 days. 12/13/22 12/23/22  Jackie Plum, MD  oxyCODONE (OXY IR/ROXICODONE) 5 MG immediate release tablet Take 1 tablet (5 mg total) by mouth every 4 (four) hours as needed for up to 5 days for moderate pain. 12/13/22 12/18/22  Jackie Plum, MD  Prenatal Vit-Fe Fumarate-FA (MULTIVITAMIN-PRENATAL) 27-0.8 MG TABS tablet Take 1 tablet by mouth daily at 12 noon.    [provider]    Family History Family History  Problem Relation Age of Onset   Cancer Mother    Healthy Mother    Healthy Father    Hypertension Maternal Grandmother    Cancer Other    Asthma Neg Hx    Diabetes Neg Hx    Heart disease Neg Hx    Stroke Neg Hx     Social History Social History   Tobacco Use   Smoking status: Never    Passive exposure: Yes  Smokeless tobacco: Never  Vaping Use   Vaping Use: Never used  Substance Use Topics   Alcohol use: Not Currently   Drug use: Yes    Types: Marijuana     Allergies   Patient has no known allergies.   Review of Systems Review of Systems  Constitutional: Negative.   HENT:  Positive for congestion and rhinorrhea.   Respiratory:  Positive for cough. Negative for shortness of breath and wheezing.   Cardiovascular: Negative.   Gastrointestinal: Negative.   Musculoskeletal: Negative.   Psychiatric/Behavioral: Negative.       Physical Exam Triage Vital Signs ED Triage Vitals  Enc Vitals Group     BP 12/17/22 1204 129/89     Pulse Rate 12/17/22 1204 77     Resp 12/17/22 1204 16     Temp 12/17/22 1204 98.6 F (37 C)     Temp Source 12/17/22 1204 Oral     SpO2 12/17/22 1204 97 %     Weight --      Height --      Head Circumference --      Peak Flow --      Pain Score 12/17/22 1205 0     Pain Loc --      Pain Edu? --      Excl. in GC? --    No data found.  Updated  Vital Signs BP 129/89 (BP Location: Right Arm)   Pulse 77   Temp 98.6 F (37 C) (Oral)   Resp 16   LMP 03/07/2022 (Exact Date)   SpO2 97%   Breastfeeding No   Visual Acuity Right Eye Distance:   Left Eye Distance:   Bilateral Distance:    Right Eye Near:   Left Eye Near:    Bilateral Near:     Physical Exam Constitutional:      General: She is not in acute distress.    Appearance: Normal appearance. She is not ill-appearing.  HENT:     Right Ear: Tympanic membrane normal.     Left Ear: Tympanic membrane normal.     Nose: Congestion and rhinorrhea present.     Mouth/Throat:     Mouth: Mucous membranes are moist.     Pharynx: Posterior oropharyngeal erythema present. No oropharyngeal exudate.  Cardiovascular:     Rate and Rhythm: Normal rate and regular rhythm.  Pulmonary:     Effort: Pulmonary effort is normal.     Breath sounds: Normal breath sounds.  Musculoskeletal:     Cervical back: Normal range of motion and neck supple. No tenderness.  Skin:    General: Skin is warm.  Neurological:     General: No focal deficit present.     Mental Status: She is alert.  Psychiatric:        Mood and Affect: Mood normal.      UC Treatments / Results  Labs (all labs ordered are listed, but only abnormal results are displayed) Labs Reviewed - No data to display  EKG   Radiology No results found.  Procedures Procedures (including critical care time)  Medications Ordered in UC Medications - No data to display  Initial Impression / Assessment and Plan / UC Course  I have reviewed the triage vital signs and the nursing notes.  Pertinent labs & imaging results that were available during my care of the patient were reviewed by me and considered in my medical decision making (see chart for details).   Final Clinical Impressions(s) / UC  Diagnoses   Final diagnoses:  Upper respiratory tract infection, unspecified type     Discharge Instructions      You were  seen today for a viral upper respiratory infection.  No evidence of bacterial infection needing an antibiotic.  I have sent out tessalon perles for your cough.  You may take over the counter sudafed or zyrtec to help with sinus congestion and fluids.  Please get plenty of rest and fluids as well.     ED Prescriptions     Medication Sig Dispense Auth. Provider   benzonatate (TESSALON) 200 MG capsule Take 1 capsule (200 mg total) by mouth 3 (three) times daily as needed for cough. 21 capsule Jannifer Franklin, MD      PDMP not reviewed this encounter.   Jannifer Franklin, MD 12/17/22 1225

## 2022-12-17 NOTE — ED Triage Notes (Signed)
Patient c/o nasal congestion, a productive cough with light greens sputum, and slight SOB since 12/11/22.   Patient states she took Dayquil last night and this AM.

## 2022-12-22 ENCOUNTER — Telehealth (HOSPITAL_COMMUNITY): Payer: Self-pay | Admitting: *Deleted

## 2022-12-22 NOTE — Telephone Encounter (Signed)
Left phone voicemail message.  Duffy Rhody, RN 12-22-2022 at 10:48am

## 2023-01-26 ENCOUNTER — Ambulatory Visit: Payer: Self-pay

## 2023-01-26 NOTE — Telephone Encounter (Addendum)
  Chief Complaint: left areola pain Symptoms: lump noted Frequency: yesterday Pertinent Negatives: Patient denies redness, nipple discharge,  Disposition: [] ED /[] Urgent Care (no appt availability in office) / [] Appointment(In office/virtual)/ []  [facility]/ [] Home Care/ [] Refused Recommended Disposition /[] [facility]/ [x]  Follow-up with PCP Additional Notes: pt has OBGYN and stated she will call to see if they can get her an appt. Was trying to get pt PCP NP appt but call dropped. Was looking at [facility] for appt next available appt  in August. Advised [facility].  Attempted to call pt , LM on VM to call back [phone]. Reason for Disposition  Breast lump  Answer Assessment - Initial Assessment Questions 1. SYMPTOM: "What's the main symptom you're concerned about?"  (e.g., lump, pain, rash, nipple discharge)     Lump, pain 2. LOCATION: "Where is the lump located?"     Left areola 3. ONSET: "When did pain  start?"     Yesterday  4. PRIOR HISTORY: "Do you have any history of prior problems with your breasts?" (e.g., lumps, cancer, fibrocystic breast disease)     no 5. CAUSE: "What do you think is causing this symptom?"     lump 6. OTHER SYMPTOMS: "Do you have any other symptoms?" (e.g., fever, breast pain, redness or rash, nipple discharge)     redness 7. PREGNANCY-BREASTFEEDING: "Is there any chance you are pregnant?" "When was your last menstrual period?" "Are you breastfeeding?"     N/a  Protocols used: Breast Symptoms-A-AH

## 2023-03-09 NOTE — Progress Notes (Signed)
 This encounter was created in error - please disregard.

## 2023-08-19 ENCOUNTER — Ambulatory Visit (HOSPITAL_COMMUNITY)
Admission: EM | Admit: 2023-08-19 | Discharge: 2023-08-19 | Disposition: A | Discharge status: Home / Self Care | Payer: Medicaid Other | Attending: Nurse Practitioner | Admitting: Nurse Practitioner

## 2023-08-19 ENCOUNTER — Other Ambulatory Visit: Payer: Self-pay

## 2023-08-19 ENCOUNTER — Encounter (HOSPITAL_COMMUNITY): Payer: Self-pay | Admitting: *Deleted

## 2023-08-19 DIAGNOSIS — J069 Acute upper respiratory infection, unspecified: Secondary | ICD-10-CM | POA: Diagnosis not present

## 2023-08-19 MED ORDER — BENZONATATE 200 MG PO CAPS: 200.0000 mg | ORAL_CAPSULE | Freq: Three times a day (TID) | ORAL | 0 refills | Status: DC | PRN

## 2023-08-19 MED ORDER — PROMETHAZINE-DM 6.25-15 MG/5ML PO SYRP: 5.0000 mL | ORAL_SOLUTION | Freq: Every evening | ORAL | 0 refills | Status: DC | PRN

## 2023-08-19 NOTE — Discharge Instructions (Signed)
 You have a viral upper respiratory infection.  Symptoms should improve over the next week to 10 days.  If you develop chest pain or shortness of breath, go to the emergency room.  Some things that can make you feel better are: - Increased rest - Increasing fluid with water/sugar free electrolytes - Acetaminophen and ibuprofen as needed for fever/pain - Salt water gargling, chloraseptic spray and throat lozenges - OTC guaifenesin (Mucinex) 600 mg twice daily - Saline sinus flushes or a neti pot - Humidifying the air -Tessalon Perles every 8 hours as needed for dry cough and cough syrup at night time as needed for dry cough

## 2023-08-19 NOTE — ED Triage Notes (Signed)
Pt reports she has had a real bad cough for 1 week. Pt now has muscle pain when coughing.

## 2023-08-19 NOTE — ED Provider Notes (Signed)
[facility]    CSN: [id number] Arrival date & time: 08/19/23  0847      History   Chief Complaint Chief Complaint  Patient presents with   Cough    HPI Leslie Bryan is a 22 y.o. female.   Patient presents today with 5-day history of congested and dry cough, chest pain when she coughs, sore throat from coughing so much, decreased appetite because it hurts to swallow at times, and fatigue.  She also reports initially had a runny nose, but this is not improved.  Now she is mostly dealing with a dry cough that hurts her chest.  No fever, body aches or chills, shortness of breath, headache, ear pain, abdominal pain, nausea/vomiting, or diarrhea.  Has taken extra strength Tylenol and Mucinex for symptoms with mild improvement.  Denies current or history of smoking/vaping.  Denies history of asthma.    Past Medical History:  Diagnosis Date   Bell's palsy    Heart murmur     Patient Active Problem List   Diagnosis Date Noted   Status post cesarean section 12/13/2022   Elevated BP without diagnosis of hypertension 12/13/2022   Pregnancy 12/10/2022   Encounter for induction of labor 12/10/2022   High risk social situation 06/04/2017   Obesity, pediatric, BMI 95th to 98th percentile for age 13/18/2015   Seasonal allergies 09/06/2012   Behavior problem 09/02/2011    Past Surgical History:  Procedure Laterality Date   CESAREAN SECTION N/A 12/11/2022   Procedure: CESAREAN SECTION;  Surgeon: Steva Ready, DO;  Location: [facility];  Service: Obstetrics;  Laterality: N/A;   NO PAST SURGERIES      OB History     Gravida  1   Para  1   Term  1   Preterm  0   AB  0   Living  1      SAB  0   IAB  0   Ectopic  0   Multiple  0   Live Births  1            Home Medications    Prior to Admission medications   Medication Sig Start Date End Date Taking? Authorizing Provider  promethazine-dextromethorphan (PROMETHAZINE-DM) 6.25-15 MG/5ML  syrup Take 5 mLs by mouth at bedtime as needed for cough. Do not take with alcohol or while driving or operating heavy machinery.  May cause drowsiness. 08/19/23  Yes Valentino Nose, NP  benzonatate (TESSALON) 200 MG capsule Take 1 capsule (200 mg total) by mouth 3 (three) times daily as needed for cough. Do not take with alcohol or while driving or operating heavy machinery.  May cause drowsiness. 08/19/23   Valentino Nose, NP  ferrous sulfate 325 (65 FE) MG tablet Take 1 tablet (325 mg total) by mouth daily with breakfast. 12/13/22 12/13/23  Jackie Plum, MD    Family History Family History  Problem Relation Age of Onset   Cancer Mother    Healthy Mother    Healthy Father    Hypertension Maternal Grandmother    Cancer Other    Asthma Neg Hx    Diabetes Neg Hx    Heart disease Neg Hx    Stroke Neg Hx     Social History Social History   Tobacco Use   Smoking status: Never    Passive exposure: Yes   Smokeless tobacco: Never  Vaping Use   Vaping status: Never Used  Substance Use Topics   Alcohol  use: Not Currently   Drug use: Yes    Types: Marijuana     Allergies   Patient has no known allergies.   Review of Systems Review of Systems Per HPI  Physical Exam Triage Vital Signs ED Triage Vitals  Encounter Vitals Group     BP 08/19/23 0909 108/72     Systolic BP Percentile --      Diastolic BP Percentile --      Pulse Rate 08/19/23 0909 73     Resp 08/19/23 0909 20     Temp 08/19/23 0909 98.6 F (37 C)     Temp src --      SpO2 08/19/23 0909 97 %     Weight --      Height --      Head Circumference --      Peak Flow --      Pain Score 08/19/23 0908 8     Pain Loc --      Pain Education --      Exclude from Growth Chart --    No data found.  Updated Vital Signs BP 108/72   Pulse 73   Temp 98.6 F (37 C)   Resp 20   LMP 08/05/2023 (Approximate)   SpO2 97%   Breastfeeding No   Visual Acuity Right Eye Distance:   Left Eye Distance:    Bilateral Distance:    Right Eye Near:   Left Eye Near:    Bilateral Near:     Physical Exam Vitals and nursing note reviewed.  Constitutional:      General: She is not in acute distress.    Appearance: Normal appearance. She is not ill-appearing or toxic-appearing.  HENT:     Head: Normocephalic and atraumatic.     Right Ear: Tympanic membrane, ear canal and external ear normal.     Left Ear: Tympanic membrane, ear canal and external ear normal.     Nose: No congestion or rhinorrhea.     Mouth/Throat:     Mouth: Mucous membranes are moist.     Pharynx: Oropharynx is clear. No oropharyngeal exudate or posterior oropharyngeal erythema.  Eyes:     General: No scleral icterus.    Extraocular Movements: Extraocular movements intact.  Cardiovascular:     Rate and Rhythm: Normal rate and regular rhythm.  Pulmonary:     Effort: Pulmonary effort is normal. No respiratory distress.     Breath sounds: Normal breath sounds. No wheezing, rhonchi or rales.  Musculoskeletal:     Cervical back: Normal range of motion and neck supple.  Lymphadenopathy:     Cervical: No cervical adenopathy.  Skin:    General: Skin is warm and dry.     Coloration: Skin is not jaundiced or pale.     Findings: No erythema or rash.  Neurological:     Mental Status: She is alert and oriented to person, place, and time.  Psychiatric:        Behavior: Behavior is cooperative.      UC Treatments / Results  Labs (all labs ordered are listed, but only abnormal results are displayed) Labs Reviewed - No data to display  EKG   Radiology No results found.  Procedures Procedures (including critical care time)  Medications Ordered in UC Medications - No data to display  Initial Impression / Assessment and Plan / UC Course  I have reviewed the triage vital signs and the nursing notes.  Pertinent labs & imaging results that  were available during my care of the patient were reviewed by me and considered  in my medical decision making (see chart for details).   Patient is well-appearing, normotensive, afebrile, not tachycardic, not tachypneic, oxygenating well on room air.    1. Viral URI with cough Suspect viral etiology Overall, vitals and exam are reassuring Supportive care discussed with patient, start cough suppressant medication ER and return precautions discussed  The patient was given the opportunity to ask questions.  All questions answered to their satisfaction.  The patient is in agreement to this plan.    Final Clinical Impressions(s) / UC Diagnoses   Final diagnoses:  Viral URI with cough     Discharge Instructions      You have a viral upper respiratory infection.  Symptoms should improve over the next week to 10 days.  If you develop chest pain or shortness of breath, go to the emergency room.  Some things that can make you feel better are: - Increased rest - Increasing fluid with water/sugar free electrolytes - Acetaminophen and ibuprofen as needed for fever/pain - Salt water gargling, chloraseptic spray and throat lozenges - OTC guaifenesin (Mucinex) 600 mg twice daily - Saline sinus flushes or a neti pot - Humidifying the air -Tessalon Perles every 8 hours as needed for dry cough and cough syrup at night time as needed for dry cough     ED Prescriptions     Medication Sig Dispense Auth. Provider   benzonatate (TESSALON) 200 MG capsule Take 1 capsule (200 mg total) by mouth 3 (three) times daily as needed for cough. Do not take with alcohol or while driving or operating heavy machinery.  May cause drowsiness. 21 capsule Cathlean Marseilles A, NP   promethazine-dextromethorphan (PROMETHAZINE-DM) 6.25-15 MG/5ML syrup Take 5 mLs by mouth at bedtime as needed for cough. Do not take with alcohol or while driving or operating heavy machinery.  May cause drowsiness. 118 mL Valentino Nose, NP      PDMP not reviewed this encounter.   Valentino Nose,  NP 08/19/23 1002

## 2023-11-02 ENCOUNTER — Encounter (HOSPITAL_COMMUNITY): Payer: Self-pay

## 2023-11-02 ENCOUNTER — Ambulatory Visit (HOSPITAL_COMMUNITY)
Admission: EM | Admit: 2023-11-02 | Discharge: 2023-11-02 | Disposition: A | Discharge status: Home / Self Care | Attending: Family Medicine | Admitting: Family Medicine

## 2023-11-02 DIAGNOSIS — R519 Headache, unspecified: Secondary | ICD-10-CM | POA: Diagnosis present

## 2023-11-02 DIAGNOSIS — N10 Acute tubulo-interstitial nephritis: Secondary | ICD-10-CM | POA: Insufficient documentation

## 2023-11-02 LAB — POCT INFLUENZA A/B
Influenza A, POC: NEGATIVE
Influenza B, POC: NEGATIVE

## 2023-11-02 LAB — POCT URINALYSIS DIP (MANUAL ENTRY)
Glucose, UA: NEGATIVE mg/dL
Leukocytes, UA: NEGATIVE
Nitrite, UA: POSITIVE — AB
Protein Ur, POC: 100 mg/dL — AB
Spec Grav, UA: >=1.03 — AB
Urobilinogen, UA: 1 U/dL
pH, UA: 5.5

## 2023-11-02 LAB — POCT URINE PREGNANCY: Preg Test, Ur: NEGATIVE

## 2023-11-02 MED ORDER — ACETAMINOPHEN 325 MG PO TABS
ORAL_TABLET | ORAL | Status: AC
  Filled 2023-11-02: qty 2

## 2023-11-02 MED ORDER — ACETAMINOPHEN 325 MG PO TABS
650.0000 mg | ORAL_TABLET | Freq: Once | ORAL | Status: AC
  Administered 2023-11-02: 650 mg via ORAL

## 2023-11-02 MED ORDER — CIPROFLOXACIN HCL 500 MG PO TABS: 500.0000 mg | ORAL_TABLET | Freq: Two times a day (BID) | ORAL | 0 refills | Status: AC

## 2023-11-02 MED ORDER — KETOROLAC TROMETHAMINE 30 MG/ML IJ SOLN
30.0000 mg | Freq: Once | INTRAMUSCULAR | Status: AC
  Administered 2023-11-02: 30 mg via INTRAMUSCULAR

## 2023-11-02 MED ORDER — DEXAMETHASONE SODIUM PHOSPHATE 10 MG/ML IJ SOLN
INTRAMUSCULAR | Status: AC
  Filled 2023-11-02: qty 1

## 2023-11-02 MED ORDER — KETOROLAC TROMETHAMINE 30 MG/ML IJ SOLN
INTRAMUSCULAR | Status: AC
  Filled 2023-11-02: qty 1

## 2023-11-02 MED ORDER — DEXAMETHASONE SODIUM PHOSPHATE 10 MG/ML IJ SOLN
10.0000 mg | Freq: Once | INTRAMUSCULAR | Status: AC
  Administered 2023-11-02: 10 mg via INTRAMUSCULAR

## 2023-11-02 NOTE — ED Triage Notes (Signed)
 Pt c/o migraine headache and rt flank/back pain x3 days ago after her menstrual went off. States today had a near syncopal with am with weakness and lightheadedness. Took Ibuprofen with no relief, took aleve and it helped the rt side pain.

## 2023-11-02 NOTE — ED Provider Notes (Signed)
 [facility]    CSN: [id number] Arrival date & time: 11/02/23  1807      History   Chief Complaint Chief Complaint  Patient presents with   Migraine   Flank Pain    HPI Leslie Bryan is a 23 y.o. female.   Patient presents with severe headache that radiates down her neck that began on 3/9.  Patient also endorses some mild photophobia.  Denies nausea, vomiting, blurred vision, dizziness, numbness, and confusion.  Patient has a fever in clinic, 102.6.  Patient denies known fever at home, cough, congestion, and sore throat.  Patient also reports severe right back pain that began on 3/9 after the headache began.  Denies dysuria, hematuria, urinary frequency/urgency, abnormal vaginal discharge, or abnormal vaginal bleeding.  Patient reports taking ibuprofen and Aleve which helped with back pain some, but states it did not help with headache.   Migraine  Flank Pain    Past Medical History:  Diagnosis Date   Bell's palsy    Heart murmur     Patient Active Problem List   Diagnosis Date Noted   Status post cesarean section 12/13/2022   Elevated BP without diagnosis of hypertension 12/13/2022   Pregnancy 12/10/2022   Encounter for induction of labor 12/10/2022   High risk social situation 06/04/2017   Obesity, pediatric, BMI 95th to 98th percentile for age 90/18/2015   Seasonal allergies 09/06/2012   Behavior problem 09/02/2011    Past Surgical History:  Procedure Laterality Date   CESAREAN SECTION N/A 12/11/2022   Procedure: CESAREAN SECTION;  Surgeon: Steva Ready, DO;  Location: [facility];  Service: Obstetrics;  Laterality: N/A;   NO PAST SURGERIES      OB History     Gravida  1   Para  1   Term  1   Preterm  0   AB  0   Living  1      SAB  0   IAB  0   Ectopic  0   Multiple  0   Live Births  1            Home Medications    Prior to Admission medications   Medication Sig Start Date End Date Taking? Authorizing  Provider  ciprofloxacin (CIPRO) 500 MG tablet Take 1 tablet (500 mg total) by mouth every 12 (twelve) hours for 7 days. 11/02/23 11/09/23 Yes Letta Kocher, NP    Family History Family History  Problem Relation Age of Onset   Cancer Mother    Healthy Mother    Healthy Father    Hypertension Maternal Grandmother    Cancer Other    Asthma Neg Hx    Diabetes Neg Hx    Heart disease Neg Hx    Stroke Neg Hx     Social History Social History   Tobacco Use   Smoking status: Never    Passive exposure: Yes   Smokeless tobacco: Never  Vaping Use   Vaping status: Never Used  Substance Use Topics   Alcohol use: Not Currently   Drug use: Yes    Types: Marijuana     Allergies   Patient has no known allergies.   Review of Systems Review of Systems  Genitourinary:  Positive for flank pain.   Per HPI  Physical Exam Triage Vital Signs ED Triage Vitals  Encounter Vitals Group     BP 11/02/23 2012 118/73     Systolic BP Percentile --  Diastolic BP Percentile --      Pulse Rate 11/02/23 2012 (!) 104     Resp 11/02/23 2012 18     Temp 11/02/23 2012 (!) 102.6 F (39.2 C)     Temp Source 11/02/23 2012 Oral     SpO2 11/02/23 2012 98 %     Weight --      Height --      Head Circumference --      Peak Flow --      Pain Score 11/02/23 2013 8     Pain Loc --      Pain Education --      Exclude from Growth Chart --    No data found.  Updated Vital Signs BP 118/73 (BP Location: Left Arm)   Pulse (!) 104   Temp (!) 102.6 F (39.2 C) (Oral)   Resp 18   LMP 10/25/2023   SpO2 98%   Breastfeeding No   Visual Acuity Right Eye Distance:   Left Eye Distance:   Bilateral Distance:    Right Eye Near:   Left Eye Near:    Bilateral Near:     Physical Exam Vitals and nursing note reviewed.  Constitutional:      General: She is awake. She is not in acute distress.    Appearance: Normal appearance. She is well-developed and well-groomed. She is not ill-appearing.   HENT:     Right Ear: Tympanic membrane, ear canal and external ear normal.     Left Ear: Tympanic membrane, ear canal and external ear normal.     Nose: Nose normal.     Mouth/Throat:     Mouth: Mucous membranes are moist.     Pharynx: Oropharynx is clear.  Eyes:     Extraocular Movements: Extraocular movements intact.     Pupils: Pupils are equal, round, and reactive to light.  Cardiovascular:     Rate and Rhythm: Regular rhythm. Tachycardia present.  Pulmonary:     Effort: Pulmonary effort is normal.     Breath sounds: Normal breath sounds.  Abdominal:     General: Bowel sounds are normal.     Palpations: Abdomen is soft.     Tenderness: There is no abdominal tenderness. There is no right CVA tenderness or left CVA tenderness.  Musculoskeletal:     Cervical back: Normal range of motion and neck supple. Tenderness present. No bony tenderness. Pain with movement present. Normal range of motion.     Lumbar back: Tenderness present. No bony tenderness. Normal range of motion.  Skin:    General: Skin is warm and dry.  Neurological:     Mental Status: She is alert.  Psychiatric:        Behavior: Behavior is cooperative.      UC Treatments / Results  Labs (all labs ordered are listed, but only abnormal results are displayed) Labs Reviewed  POCT URINALYSIS DIP (MANUAL ENTRY) - Abnormal; Notable for the following components:      Result Value   Color, UA straw (*)    Clarity, UA cloudy (*)    Bilirubin, UA small (*)    Ketones, POC UA moderate (40) (*)    Spec Grav, UA >=1.030 (*)    Blood, UA trace-intact (*)    Protein Ur, POC =100 (*)    Nitrite, UA Positive (*)    All other components within normal limits  POCT URINE PREGNANCY - Normal  URINE CULTURE  POCT INFLUENZA A/B  EKG   Radiology No results found.  Procedures Procedures (including critical care time)  Medications Ordered in UC Medications  acetaminophen (TYLENOL) tablet 650 mg (650 mg Oral Given  11/02/23 2017)  ketorolac (TORADOL) 30 MG/ML injection 30 mg (30 mg Intramuscular Given 11/02/23 2059)  dexamethasone (DECADRON) injection 10 mg (10 mg Intramuscular Given 11/02/23 2059)    Initial Impression / Assessment and Plan / UC Course  I have reviewed the triage vital signs and the nursing notes.  Pertinent labs & imaging results that were available during my care of the patient were reviewed by me and considered in my medical decision making (see chart for details).     Upon assessment tachycardia is present likely related to fever of 102.6.  Tylenol given.  No neurodeficits on exam.  GCS 15.  EOMI and PERRLA.  Tenderness noted to bilateral posterior neck and patient endorses movement pain.  Tenderness also noted to right lumbar back that wraps around her side.  UA revealed positive nitrites, protein, ketones, trace amounts of blood, and bilirubin, will send urine culture.  Prescribed ciprofloxacin for possible pyelonephritis.  Given Toradol and Decadron in clinic to help with headache and back pain.  Discussed return and strict ER precautions. Final Clinical Impressions(s) / UC Diagnoses   Final diagnoses:  Bad headache  Acute pyelonephritis     Discharge Instructions      You were given an injection of Toradol and Decadron to help with your headache today.    Your urine did reveal signs of a urinary tract infection today.  Due to the location of your pain and believe this could be a kidney infection.  Will send a urine culture to confirm presence of bacteria.  I have prescribed ciprofloxacin for you to take twice daily for 7 days for infection coverage.  Your results from the urine culture will come back over the next few days and someone will call and adjust treatment if necessary.  Return here as needed. If your headache persists or you develop any new or worsening symptoms please seek immediate medical treatment in the ER.      ED Prescriptions     Medication Sig  Dispense Auth. Provider   ciprofloxacin (CIPRO) 500 MG tablet Take 1 tablet (500 mg total) by mouth every 12 (twelve) hours for 7 days. 14 tablet Wynonia Lawman A, NP      PDMP not reviewed this encounter.   Wynonia Lawman A, NP 11/02/23 2108

## 2023-11-02 NOTE — Discharge Instructions (Signed)
 You were given an injection of Toradol and Decadron to help with your headache today.    Your urine did reveal signs of a urinary tract infection today.  Due to the location of your pain and believe this could be a kidney infection.  Will send a urine culture to confirm presence of bacteria.  I have prescribed ciprofloxacin for you to take twice daily for 7 days for infection coverage.  Your results from the urine culture will come back over the next few days and someone will call and adjust treatment if necessary.  Return here as needed. If your headache persists or you develop any new or worsening symptoms please seek immediate medical treatment in the ER.

## 2023-11-04 LAB — URINE CULTURE: Culture: >=100000 — AB

## 2024-04-06 ENCOUNTER — Ambulatory Visit (HOSPITAL_COMMUNITY)
Admission: EM | Admit: 2024-04-06 | Discharge: 2024-04-06 | Disposition: A | Discharge status: Home / Self Care | Attending: Family Medicine | Admitting: Family Medicine

## 2024-04-06 ENCOUNTER — Encounter (HOSPITAL_COMMUNITY): Payer: Self-pay

## 2024-04-06 ENCOUNTER — Ambulatory Visit (INDEPENDENT_AMBULATORY_CARE_PROVIDER_SITE_OTHER)

## 2024-04-06 DIAGNOSIS — M25571 Pain in right ankle and joints of right foot: Secondary | ICD-10-CM

## 2024-04-06 DIAGNOSIS — S93491A Sprain of other ligament of right ankle, initial encounter: Secondary | ICD-10-CM | POA: Diagnosis not present

## 2024-04-06 MED ORDER — IBUPROFEN 800 MG PO TABS: 800.0000 mg | ORAL_TABLET | Freq: Three times a day (TID) | ORAL | 0 refills | Status: AC

## 2024-04-06 NOTE — ED Triage Notes (Signed)
 Patient is here for right ankle pain. Patient states she step down onto the step wring this morning.  No medications have been taken. Pain 06/10

## 2024-04-06 NOTE — ED Provider Notes (Signed)
 [facility]   [id number] 04/06/24 Arrival Time: 9162  ASSESSMENT & PLAN:  1. Acute right ankle pain    I have personally viewed and independently interpreted the imaging studies ordered this visit. R ankle: no acute bony abnormalities.  Meds ordered this encounter  Medications   ibuprofen  (ADVIL ) 800 MG tablet    Sig: Take 1 tablet (800 mg total) by mouth 3 (three) times daily with meals.    Dispense:  21 tablet    Refill:  0    Orders Placed This Encounter  Procedures   DG Ankle Complete Right   Apply ASO lace-up ankle brace  Wear ASO for next week.  Work/school excuse note: provided. Recommend:   Follow-up Information     Schedule an appointment as soon as possible for a visit  with [facility].   Why: For follow up. Contact information: [address] [place]  [address] [phone]               Reviewed expectations re: course of current medical issues. Questions answered. Outlined signs and symptoms indicating need for more acute intervention. Patient verbalized understanding. After Visit Summary given.  SUBJECTIVE: History from: patient. Leslie Bryan is a 23 y.o. female who reports R lateral ankle pain s/p inversion injury this morning. Able to bear wt but painful. Mild swelling. Denies extremity sensation changes or weakness. No tx PTA.  Past Surgical History:  Procedure Laterality Date   CESAREAN SECTION N/A 12/11/2022   Procedure: CESAREAN SECTION;  Surgeon: Storm Setter, DO;  Location: [facility];  Service: Obstetrics;  Laterality: N/A;   NO PAST SURGERIES        OBJECTIVE:  Vitals:   04/06/24 0947  BP: 132/82  Pulse: 68  Resp: 20  Temp: 98.5 F (36.9 C)  TempSrc: Oral  SpO2: 98%    General appearance: alert; no distress HEENT: [place]; AT Neck: supple with FROM Resp: unlabored respirations Extremities: RLE: warm with well perfused appearance; fairly well  localized moderate tenderness over right posterior malleolus; without gross deformities; swelling: minimal; bruising: none; ankle ROM: normal, with discomfort CV: brisk extremity capillary refill of RLE; 2+ DP pulse of RLE. Skin: warm and dry; no visible rashes Neurologic: normal sensation and strength of RLE Psychological: alert and cooperative; normal mood and affect  Imaging: DG Ankle Complete Right Result Date: 04/06/2024 CLINICAL DATA:  Acute right ankle pain after inversion injury today. EXAM: RIGHT ANKLE - COMPLETE 3+ VIEW COMPARISON:  None Available. FINDINGS: There is no evidence of fracture, dislocation, or joint effusion. There is no evidence of arthropathy or other focal bone abnormality. Soft tissues are unremarkable. IMPRESSION: Negative. Electronically Signed   By: Lynwood Landy Raddle M.D.   On: 04/06/2024 11:01       No Known Allergies  Past Medical History:  Diagnosis Date   Bell's palsy    Heart murmur    Social History   Socioeconomic History   Marital status: Single    Spouse name: Not on file   Number of children: Not on file   Years of education: current 11th grade   Highest education level: 10th grade  Occupational History   Occupation: student  Tobacco Use   Smoking status: Never    Passive exposure: Yes   Smokeless tobacco: Never  Vaping Use   Vaping status: Never Used  Substance and Sexual Activity   Alcohol use: Not Currently   Drug use: Yes  Types: Marijuana   Sexual activity: Yes    Birth control/protection: Condom  Other Topics Concern   Not on file  Social History Narrative   Not on file   Social Drivers of Health   Financial Resource Strain: Low Risk  (10/19/2019)   Overall Financial Resource Strain (CARDIA)    Difficulty of Paying Living Expenses: Not hard at all  Food Insecurity: No Food Insecurity (12/10/2022)   Hunger Vital Sign    Worried About Running Out of Food in the Last Year: Never true    Ran Out of Food in the Last Year:  Never true  Transportation Needs: Unmet Transportation Needs (12/10/2022)   PRAPARE - Administrator, Civil Service (Medical): Yes    Lack of Transportation (Non-Medical): No  Physical Activity: Not on file  Stress: Stress Concern Present (10/19/2019)   [employer] of Occupational Health - Occupational Stress Questionnaire    Feeling of Stress : Very much  Social Connections: Unknown (01/04/2022)   [facility]   Social Network    Social Network: Not on file   Family History  Problem Relation Age of Onset   Cancer Mother    Healthy Mother    Healthy Father    Hypertension Maternal Grandmother    Cancer Other    Asthma Neg Hx    Diabetes Neg Hx    Heart disease Neg Hx    Stroke Neg Hx    Past Surgical History:  Procedure Laterality Date   CESAREAN SECTION N/A 12/11/2022   Procedure: CESAREAN SECTION;  Surgeon: Storm Setter, DO;  Location: [facility];  Service: Obstetrics;  Laterality: N/A;   NO PAST SURGERIES         Rolinda Rogue, MD 04/06/24 1229
